# Patient Record
Sex: Male | Born: 1945
Health system: Southern US, Community
[De-identification: ages and names within clinical notes are randomized; demographics above are authoritative.]

## PROBLEM LIST (undated history)

## (undated) DIAGNOSIS — N189 Chronic kidney disease, unspecified: Secondary | ICD-10-CM

## (undated) DIAGNOSIS — I499 Cardiac arrhythmia, unspecified: Secondary | ICD-10-CM

## (undated) DIAGNOSIS — E079 Disorder of thyroid, unspecified: Secondary | ICD-10-CM

## (undated) HISTORY — PX: MULTIPLE TOOTH EXTRACTIONS: SHX2053

## (undated) HISTORY — DX: Disorder of thyroid, unspecified: E07.9

---

## 1999-06-15 ENCOUNTER — Other Ambulatory Visit: Admission: RE | Admit: 1999-06-15 | Discharge: 1999-06-15 | Payer: Self-pay | Admitting: Internal Medicine

## 2012-11-14 ENCOUNTER — Encounter (HOSPITAL_COMMUNITY): Payer: Self-pay | Admitting: Emergency Medicine

## 2012-11-14 ENCOUNTER — Emergency Department (INDEPENDENT_AMBULATORY_CARE_PROVIDER_SITE_OTHER)
Admission: EM | Admit: 2012-11-14 | Discharge: 2012-11-14 | Disposition: A | Discharge status: Home / Self Care | Payer: Medicare Other | Source: Home / Self Care | Attending: Family Medicine | Admitting: Family Medicine

## 2012-11-14 ENCOUNTER — Emergency Department (INDEPENDENT_AMBULATORY_CARE_PROVIDER_SITE_OTHER): Payer: Medicare Other

## 2012-11-14 DIAGNOSIS — S62609B Fracture of unspecified phalanx of unspecified finger, initial encounter for open fracture: Secondary | ICD-10-CM

## 2012-11-14 MED ORDER — HYDROCODONE-ACETAMINOPHEN 5-325 MG PO TABS: 1.0000 | ORAL_TABLET | ORAL | Status: DC | PRN

## 2012-11-14 NOTE — Consult Note (Signed)
Reason for Consult:left long crush injury Referring Physician: [name] is an 67 y.o. male.  HPI: s/p wood spliter injury with open left long finger fracture  History reviewed. No pertinent past medical history.  History reviewed. No pertinent past surgical history.  No family history on file.  Social History:  reports that he has never smoked. He does not have any smokeless tobacco history on file. He reports that he does not drink alcohol or use illicit drugs.  Allergies: No Known Allergies  Medications: Scheduled:  No results found for this or any previous visit (from the past 48 hour(s)).  Dg Finger Middle Left  11/14/2012  *RADIOLOGY REPORT*  Clinical Data: Crush injury.  Laceration.  LEFT MIDDLE FINGER 2+V  Comparison: None  Findings: Highly comminuted fracture of the left third distal phalanx.  No visible intra-articular extension.  Overlying soft tissue defect.  IMPRESSION: Comminuted fracture of the left third distal phalanx.   Original Report Authenticated By: Charlett Nose, M.D.     Review of Systems  All other systems reviewed and are negative.   Blood pressure 141/84, pulse 81, temperature 98.4 F (36.9 C), temperature source Oral, resp. rate 18, SpO2 99.00%. Physical Exam  Constitutional: He is oriented to person, place, and time. He appears well-developed and well-nourished.  HENT:  Head: Normocephalic and atraumatic.  Cardiovascular: Normal rate.   Respiratory: Effort normal.  Musculoskeletal:       Right hand: He exhibits bony tenderness, deformity and laceration.  Neurological: He is alert and oriented to person, place, and time.  Skin: Skin is warm.  Psychiatric: He has Owen normal mood and affect. His behavior is normal. Judgment and thought content normal.    Assessment/Plan: As above  After digital block performed by urgent care MD we performed an I and D and nail bed repair if left long finger  Patient to see me in my office this  James Owen 11/14/2012, 6:33 PM

## 2012-11-14 NOTE — ED Provider Notes (Signed)
History     CSN: [id number]  Arrival date & time 11/14/12  1634   First MD Initiated Contact with Patient 11/14/12 1635      Chief Complaint  Patient presents with  . Finger Injury    (Consider location/radiation/quality/duration/timing/severity/associated sxs/prior treatment) Patient is a 67 y.o. male presenting with hand pain. The history is provided by the patient and the spouse.  Hand Pain This is a new problem. The current episode started 1 to 2 hours ago (lmf caught in wood splitter with crush injury to distal phalanx.). The problem occurs constantly. The problem has not changed since onset.   History reviewed. No pertinent past medical history.  History reviewed. No pertinent past surgical history.  No family history on file.  History  Substance Use Topics  . Smoking status: Never Smoker   . Smokeless tobacco: Not on file  . Alcohol Use: No      Review of Systems  Constitutional: Negative.   Musculoskeletal: Positive for joint swelling.  Skin: Positive for wound.    Allergies  Review of patient's allergies indicates no known allergies.  Home Medications  No current outpatient prescriptions on file.  BP 141/84  Pulse 81  Temp(Src) 98.4 F (36.9 C) (Oral)  Resp 18  SpO2 99%  Physical Exam  Nursing note and vitals reviewed. Constitutional: He is oriented to person, place, and time. He appears well-developed and well-nourished.  Musculoskeletal: He exhibits tenderness.       Hands: Neurological: He is alert and oriented to person, place, and time.  Skin: Skin is warm and dry.    ED Course  NERVE BLOCK Date/Time: 11/14/2012 5:12 PM Performed by: Linna Hoff Authorized by: Bradd Canary D Consent: Verbal consent obtained. Consent given by: patient Indications: pain relief Body area: upper extremity Nerve: digital Laterality: left Patient sedated: no Preparation: Patient was prepped and draped in the usual sterile fashion. Patient position:  sitting Needle gauge: 27 G Local anesthetic: bupivacaine 0.5% without epinephrine Anesthetic total: 4 ml Outcome: pain improved Patient tolerance: Patient tolerated the procedure well with no immediate complications.   (including critical care time)  Labs Reviewed - No data to display Dg Finger Middle Left  11/14/2012  *RADIOLOGY REPORT*  Clinical Data: Crush injury.  Laceration.  LEFT MIDDLE FINGER 2+V  Comparison: None  Findings: Highly comminuted fracture of the left third distal phalanx.  No visible intra-articular extension.  Overlying soft tissue defect.  IMPRESSION: Comminuted fracture of the left third distal phalanx.   Original Report Authenticated By: Charlett Nose, M.D.      1. Finger fracture, left, open, initial encounter       MDM  X-rays reviewed and report per radiologist. Digital block with 2% marcaine.  Referred to dr Mina Marble for repair.         Linna Hoff, MD 11/14/12 [phone]

## 2012-11-14 NOTE — ED Notes (Signed)
Pt is here for inj to left middle finger inj since 1430 today Reports splitting wood in a gas wood splitter, finger got caught in between the tree trunks Laceration below base of nail Reports being up to date w/tetanus Bleeding has been controlled  He is alert w/no signs of acute distress.

## 2013-04-12 ENCOUNTER — Encounter: Payer: Self-pay | Admitting: Internal Medicine

## 2013-06-08 ENCOUNTER — Ambulatory Visit (AMBULATORY_SURGERY_CENTER): Payer: Self-pay

## 2013-06-08 VITALS — Ht 73.0 in | Wt 204.4 lb

## 2013-06-08 DIAGNOSIS — Z860100 Personal history of colon polyps, unspecified: Secondary | ICD-10-CM

## 2013-06-08 MED ORDER — MOVIPREP 100 G PO SOLR
ORAL | Status: DC
Start: 2013-06-08 — End: 2013-06-22

## 2013-06-22 ENCOUNTER — Ambulatory Visit (AMBULATORY_SURGERY_CENTER): Payer: Medicare Other | Admitting: Internal Medicine

## 2013-06-22 ENCOUNTER — Encounter: Payer: Self-pay | Admitting: Internal Medicine

## 2013-06-22 VITALS — BP 131/103 | HR 55 | Temp 96.8°F | Resp 19 | Ht 73.0 in | Wt 204.0 lb

## 2013-06-22 DIAGNOSIS — Z860100 Personal history of colon polyps, unspecified: Secondary | ICD-10-CM

## 2013-06-22 DIAGNOSIS — D126 Benign neoplasm of colon, unspecified: Secondary | ICD-10-CM

## 2013-06-22 MED ORDER — SODIUM CHLORIDE 0.9 % IV SOLN: 500.0000 mL | INTRAVENOUS | Status: DC

## 2013-06-22 NOTE — Op Note (Signed)
 [facility] [address] [place], [address]   COLONOSCOPY PROCEDURE REPORT  PATIENT: James Owen, James Owen  MR#: [id number] BIRTHDATE: 07/09/1946 , 66  yrs. old GENDER: Male ENDOSCOPIST: Roxy Cedar, MD REFERRED WN:[id number] Program Recall PROCEDURE DATE:  06/22/2013 PROCEDURE:   Colonoscopy with snare polypectomy x 1 First Screening Colonoscopy - Avg.  risk and is 50 yrs.  old or older - No.  Prior Negative Screening - Now for repeat screening. N/A  History of Adenoma - Now for follow-up colonoscopy & has been > or = to 3 yrs.  Yes hx of adenoma.  Has been 3 or more years since last colonoscopy.  Polyps Removed Today? Yes. ASA CLASS:   Class II INDICATIONS:Patient's personal history of adenomatous colon polyps. Index 200 (TA); f/u 2004 (no polyps) MEDICATIONS: MAC sedation, administered by CRNA and propofol (Diprivan) 250mg  IV  DESCRIPTION OF PROCEDURE:   After the risks benefits and alternatives of the procedure were thoroughly explained, informed consent was obtained.  A digital rectal exam revealed no abnormalities of the rectum.   The LB [id number]  endoscope was introduced through the anus and advanced to the cecum, which was identified by both the appendix and ileocecal valve. No adverse events experienced.   The quality of the prep was good, using MoviPrep  The instrument was then slowly withdrawn as the colon was fully examined.   COLON FINDINGS: A single polyp measuring 5 mm in size was found in the sigmoid colon.  A polypectomy was performed with a cold snare. The resection was complete and the polyp tissue was completely retrieved. Oozing was managed with hot snare tip.  Moderate diverticulosis was noted in the ascending colon and The finding was left colon.   The colon mucosa was otherwise normal.  Retroflexed views revealed internal hemorrhoids. The time to cecum=3 minutes 40 seconds.  Withdrawal time=13 minutes 0 seconds.  The  scope was withdrawn and the procedure completed. COMPLICATIONS: There were no complications.  ENDOSCOPIC IMPRESSION: 1.   Single polyp measuring 5 mm in size was found in the sigmoid colon; polypectomy was performed 2.   Moderate diverticulosis was noted in the ascending colon and left colon 3.   The colon mucosa was otherwise normal  RECOMMENDATIONS: 1. Repeat colonoscopy in 5 years if polyp adenomatous; otherwise 10 years   eSigned:  Roxy Cedar, MD 06/22/2013 9:29 AM   cc: The Patient and Guerry Bruin, MD   PATIENT NAME:  James Owen, James Owen MR#: [id number]

## 2013-06-22 NOTE — Patient Instructions (Addendum)
YOU HAD AN ENDOSCOPIC PROCEDURE TODAY AT THE [facility]: Refer to the procedure report that was given to you for any specific questions about what was found during the examination.  If the procedure report does not answer your questions, please call your gastroenterologist to clarify.  If you requested that your care partner not be given the details of your procedure findings, then the procedure report has been included in a sealed envelope for you to review at your convenience later.  YOU SHOULD EXPECT: Some feelings of bloating in the abdomen. Passage of more gas than usual.  Walking can help get rid of the air that was put into your GI tract during the procedure and reduce the bloating. If you had a lower endoscopy (such as a colonoscopy or flexible sigmoidoscopy) you may notice spotting of blood in your stool or on the toilet paper. If you underwent a bowel prep for your procedure, then you may not have a normal bowel movement for a few days.  DIET: Your first meal following the procedure should be a light meal and then it is ok to progress to your normal diet.  A half-sandwich or bowl of soup is an example of a good first meal.  Heavy or fried foods are harder to digest and may make you feel nauseous or bloated.  Likewise meals heavy in dairy and vegetables can cause extra gas to form and this can also increase the bloating.  Drink plenty of fluids but you should avoid alcoholic beverages for 24 hours.  ACTIVITY: Your care partner should take you home directly after the procedure.  You should plan to take it easy, moving slowly for the rest of the day.  You can resume normal activity the day after the procedure however you should NOT DRIVE or use heavy machinery for 24 hours (because of the sedation medicines used during the test).    SYMPTOMS TO REPORT IMMEDIATELY: A gastroenterologist can be reached at any hour.  During normal business hours, 8:30 AM to 5:00 PM Monday through Friday,  call [phone].  After hours and on weekends, please call the GI answering service at [phone] who will take a message and have the physician on call contact you.   Following lower endoscopy (colonoscopy or flexible sigmoidoscopy):  Excessive amounts of blood in the stool  Significant tenderness or worsening of abdominal pains  Swelling of the abdomen that is new, acute  Fever of 100F or higher  FOLLOW UP: If any biopsies were taken you will be contacted by phone or by letter within the next 1-3 weeks.  Call your gastroenterologist if you have not heard about the biopsies in 3 weeks.  Our staff will call the home number listed on your records the next business day following your procedure to check on you and address any questions or concerns that you may have at that time regarding the information given to you following your procedure. This is a courtesy call and so if there is no answer at the home number and we have not heard from you through the emergency physician on call, we will assume that you have returned to your regular daily activities without incident.   Please read over information about polyps, high fiber diets and diverticulosis  Continue your normal mediations  Colonoscopy in 5 or 10 years- await pathology SIGNATURES/CONFIDENTIALITY: You and/or your care partner have signed paperwork which will be entered into your electronic medical record.  These signatures attest to  the fact that that the information above on your After Visit Summary has been reviewed and is understood.  Full responsibility of the confidentiality of this discharge information lies with you and/or your care-partner.

## 2013-06-22 NOTE — Progress Notes (Signed)
Lidocaine-40mg IV prior to Propofol InductionPropofol given over incremental dosages 

## 2013-06-22 NOTE — Progress Notes (Signed)
Called to room to assist during endoscopic procedure.  Patient ID and intended procedure confirmed with present staff. Received instructions for my participation in the procedure from the performing physician.  

## 2013-06-22 NOTE — Progress Notes (Signed)
Patient did not experience any of the following events: a burn prior to discharge; a fall within the facility; wrong site/side/patient/procedure/implant event; or a hospital transfer or hospital admission upon discharge from the facility. (G8907) Patient did not have preoperative order for IV antibiotic SSI prophylaxis. (G8918)  

## 2013-06-23 ENCOUNTER — Telehealth: Payer: Self-pay | Admitting: *Deleted

## 2013-06-23 NOTE — Telephone Encounter (Signed)
  Follow up Call-  Call back number 06/22/2013  Post procedure Call Back phone  # [phone]  Permission to leave phone message Yes     Patient questions:  Do you have a fever, pain , or abdominal swelling? no Pain Score  0 *  Have you tolerated food without any problems? yes  Have you been able to return to your normal activities? yes  Do you have any questions about your discharge instructions: Diet   no Medications  no Follow up visit  no  Do you have questions or concerns about your Care? no  Actions: * If pain score is 4 or above: No action needed, pain <4.pt did comment that he went back to work when he got home

## 2013-06-29 ENCOUNTER — Encounter: Payer: Self-pay | Admitting: Internal Medicine

## 2014-01-11 ENCOUNTER — Encounter: Payer: Self-pay | Admitting: Internal Medicine

## 2015-03-28 ENCOUNTER — Encounter: Payer: Self-pay | Admitting: Internal Medicine

## 2017-09-18 ENCOUNTER — Other Ambulatory Visit (HOSPITAL_COMMUNITY): Payer: Self-pay | Admitting: Otolaryngology

## 2017-09-18 ENCOUNTER — Encounter (HOSPITAL_COMMUNITY): Payer: Self-pay

## 2017-09-18 ENCOUNTER — Other Ambulatory Visit (HOSPITAL_COMMUNITY): Payer: Self-pay | Admitting: Internal Medicine

## 2017-09-18 ENCOUNTER — Emergency Department (HOSPITAL_COMMUNITY): Payer: PPO

## 2017-09-18 ENCOUNTER — Inpatient Hospital Stay (HOSPITAL_COMMUNITY)
Admission: EM | Admit: 2017-09-18 | Discharge: 2017-09-21 | DRG: 419 | Disposition: A | Discharge status: Home / Self Care | Payer: PPO | Attending: Surgery | Admitting: Surgery

## 2017-09-18 DIAGNOSIS — K819 Cholecystitis, unspecified: Secondary | ICD-10-CM

## 2017-09-18 DIAGNOSIS — R1033 Periumbilical pain: Secondary | ICD-10-CM | POA: Diagnosis not present

## 2017-09-18 DIAGNOSIS — Z9049 Acquired absence of other specified parts of digestive tract: Secondary | ICD-10-CM

## 2017-09-18 DIAGNOSIS — N289 Disorder of kidney and ureter, unspecified: Secondary | ICD-10-CM | POA: Diagnosis present

## 2017-09-18 DIAGNOSIS — R402413 Glasgow coma scale score 13-15, at hospital admission: Secondary | ICD-10-CM | POA: Diagnosis present

## 2017-09-18 DIAGNOSIS — K8020 Calculus of gallbladder without cholecystitis without obstruction: Secondary | ICD-10-CM

## 2017-09-18 DIAGNOSIS — R1011 Right upper quadrant pain: Secondary | ICD-10-CM

## 2017-09-18 DIAGNOSIS — R5383 Other fatigue: Secondary | ICD-10-CM | POA: Diagnosis not present

## 2017-09-18 DIAGNOSIS — K8000 Calculus of gallbladder with acute cholecystitis without obstruction: Secondary | ICD-10-CM | POA: Diagnosis not present

## 2017-09-18 DIAGNOSIS — I481 Persistent atrial fibrillation: Secondary | ICD-10-CM | POA: Diagnosis not present

## 2017-09-18 DIAGNOSIS — K828 Other specified diseases of gallbladder: Secondary | ICD-10-CM | POA: Diagnosis present

## 2017-09-18 DIAGNOSIS — R197 Diarrhea, unspecified: Secondary | ICD-10-CM

## 2017-09-18 DIAGNOSIS — K810 Acute cholecystitis: Secondary | ICD-10-CM | POA: Diagnosis not present

## 2017-09-18 DIAGNOSIS — Z720 Tobacco use: Secondary | ICD-10-CM | POA: Diagnosis not present

## 2017-09-18 DIAGNOSIS — K8050 Calculus of bile duct without cholangitis or cholecystitis without obstruction: Secondary | ICD-10-CM | POA: Diagnosis present

## 2017-09-18 DIAGNOSIS — R1110 Vomiting, unspecified: Secondary | ICD-10-CM

## 2017-09-18 DIAGNOSIS — R1013 Epigastric pain: Secondary | ICD-10-CM | POA: Diagnosis not present

## 2017-09-18 DIAGNOSIS — E039 Hypothyroidism, unspecified: Secondary | ICD-10-CM | POA: Diagnosis present

## 2017-09-18 DIAGNOSIS — N200 Calculus of kidney: Secondary | ICD-10-CM | POA: Diagnosis not present

## 2017-09-18 DIAGNOSIS — Z79899 Other long term (current) drug therapy: Secondary | ICD-10-CM

## 2017-09-18 DIAGNOSIS — K8066 Calculus of gallbladder and bile duct with acute and chronic cholecystitis without obstruction: Secondary | ICD-10-CM | POA: Diagnosis not present

## 2017-09-18 DIAGNOSIS — I480 Paroxysmal atrial fibrillation: Secondary | ICD-10-CM | POA: Diagnosis not present

## 2017-09-18 DIAGNOSIS — I4891 Unspecified atrial fibrillation: Secondary | ICD-10-CM | POA: Diagnosis present

## 2017-09-18 DIAGNOSIS — R17 Unspecified jaundice: Secondary | ICD-10-CM

## 2017-09-18 DIAGNOSIS — R1031 Right lower quadrant pain: Secondary | ICD-10-CM | POA: Diagnosis not present

## 2017-09-18 DIAGNOSIS — R112 Nausea with vomiting, unspecified: Secondary | ICD-10-CM | POA: Diagnosis not present

## 2017-09-18 DIAGNOSIS — K8012 Calculus of gallbladder with acute and chronic cholecystitis without obstruction: Secondary | ICD-10-CM | POA: Diagnosis not present

## 2017-09-18 DIAGNOSIS — K8062 Calculus of gallbladder and bile duct with acute cholecystitis without obstruction: Principal | ICD-10-CM | POA: Diagnosis present

## 2017-09-18 DIAGNOSIS — R109 Unspecified abdominal pain: Secondary | ICD-10-CM | POA: Diagnosis not present

## 2017-09-18 LAB — CBC
HCT: 46.6 % (ref 39.0–52.0)
HEMOGLOBIN: 15.7 g/dL (ref 13.0–17.0)
MCH: 30.7 pg (ref 26.0–34.0)
MCHC: 33.7 g/dL (ref 30.0–36.0)
MCV: 91 fL (ref 78.0–100.0)
Platelets: 174 10*3/uL (ref 150–400)
RBC: 5.12 MIL/uL (ref 4.22–5.81)
RDW: 12.8 % (ref 11.5–15.5)
WBC: 13.9 10*3/uL — ABNORMAL HIGH (ref 4.0–10.5)

## 2017-09-18 LAB — COMPREHENSIVE METABOLIC PANEL
ALBUMIN: 3.3 g/dL — AB (ref 3.5–5.0)
ALK PHOS: 62 U/L (ref 38–126)
ALT: 17 U/L (ref 17–63)
AST: 23 U/L (ref 15–41)
Anion gap: 8 (ref 5–15)
BILIRUBIN TOTAL: 1.8 mg/dL — AB (ref 0.3–1.2)
BUN: 25 mg/dL — AB (ref 6–20)
CALCIUM: 8.8 mg/dL — AB (ref 8.9–10.3)
CO2: 22 mmol/L (ref 22–32)
Chloride: 103 mmol/L (ref 101–111)
Creatinine, Ser: 1.51 mg/dL — ABNORMAL HIGH (ref 0.61–1.24)
GFR calc Af Amer: 51 mL/min — ABNORMAL LOW (ref >60)
GFR calc non Af Amer: 44 mL/min — ABNORMAL LOW (ref >60)
GLUCOSE: 135 mg/dL — AB (ref 65–99)
Potassium: 4 mmol/L (ref 3.5–5.1)
SODIUM: 133 mmol/L — AB (ref 135–145)
TOTAL PROTEIN: 6.9 g/dL (ref 6.5–8.1)

## 2017-09-18 LAB — LIPASE, BLOOD: Lipase: 29 U/L (ref 11–51)

## 2017-09-18 MED ORDER — SODIUM CHLORIDE 0.9 % IV BOLUS (SEPSIS)
500.0000 mL | Freq: Once | INTRAVENOUS | Status: AC
  Administered 2017-09-18: 500 mL via INTRAVENOUS

## 2017-09-18 MED ORDER — IOPAMIDOL (ISOVUE-300) INJECTION 61%
INTRAVENOUS | Status: AC
  Administered 2017-09-18: 100 mL via INTRAVENOUS
  Filled 2017-09-18: qty 100

## 2017-09-18 MED ORDER — MORPHINE SULFATE (PF) 4 MG/ML IV SOLN
4.0000 mg | Freq: Once | INTRAVENOUS | Status: AC
  Administered 2017-09-18: 4 mg via INTRAVENOUS
  Filled 2017-09-18: qty 1

## 2017-09-18 MED ORDER — ONDANSETRON HCL 4 MG/2ML IJ SOLN
4.0000 mg | Freq: Once | INTRAMUSCULAR | Status: AC
  Administered 2017-09-18: 4 mg via INTRAVENOUS
  Filled 2017-09-18: qty 2

## 2017-09-18 NOTE — ED Provider Notes (Signed)
[facility] DEPT Provider Note   CSN: [id number] Arrival date & time: 09/18/17  1943     History   Chief Complaint Chief Complaint  Patient presents with  . Abdominal Pain    HPI James Owen is a 72 y.o. male.  The history is provided by the patient and medical records.  Abdominal Pain   Associated symptoms include nausea and vomiting.     72 year old male with history of thyroid disease, presenting to the ED with abdominal pain.  This is been ongoing since Monday but steadily worsening over the past 4 days.  He was seen by his primary care doctor earlier today and had some lab work done which revealed an elevated white blood cell count.  He was scheduled for an outpatient CT first thing in the morning but when he returned home from his appointment pain worsened and he began vomiting so wife called EMS.  He states he has had a little bit of diarrhea, but is mostly having trouble eating.  States he has been able to drink small amounts of water but mediately vomits up any type of solid food.  He has noticed his abdomen is also distended.  He is able to pass gas.  He had a normal bowel movement yesterday.  No prior abdominal surgeries.  He did try taking some Tylenol earlier today for pain, however vomited that up as well.  Past Medical History:  Diagnosis Date  . Thyroid disease     There are no active problems to display for this patient.   Past Surgical History:  Procedure Laterality Date  . MULTIPLE TOOTH EXTRACTIONS     has all dentures       Home Medications    Prior to Admission medications   Medication Sig Start Date End Date Taking? Authorizing Provider  HYDROcodone-acetaminophen (NORCO/VICODIN) 5-325 MG per tablet Take 1 tablet by mouth every 4 (four) hours as needed for pain. 11/14/12   Billy Fischer, MD  levothyroxine (SYNTHROID, LEVOTHROID) 50 MCG tablet Take 50 mcg by mouth daily before breakfast.    [provider]  Multiple Vitamin (MULTIVITAMIN) tablet Take 1 tablet by mouth daily.    [provider]    Family History Family History  Problem Relation Age of Onset  . Melanoma Sister   . Breast cancer Sister     Social History Social History   Tobacco Use  . Smoking status: Never Smoker  . Smokeless tobacco: Current User    Types: Chew  . Tobacco comment: occasionally  Substance Use Topics  . Alcohol use: Yes    Comment: SELDOM  . Drug use: No     Allergies   Patient has no known allergies.   Review of Systems Review of Systems  Gastrointestinal: Positive for abdominal pain, nausea and vomiting.  All other systems reviewed and are negative.    Physical Exam Updated Vital Signs BP 112/81 (BP Location: Left Arm)   Pulse 75   Temp 98.7 F (37.1 C) (Oral)   Resp 18   SpO2 97%   Physical Exam  Constitutional: He is oriented to person, place, and time. He appears well-developed and well-nourished.  Appears uncomfortable  HENT:  Head: Normocephalic and atraumatic.  Mouth/Throat: Oropharynx is clear and moist.  Dry mucous membranes  Eyes: Conjunctivae and EOM are normal. Pupils are equal, round, and reactive to light.  Neck: Normal range of motion.  Cardiovascular: Normal rate, regular rhythm and normal heart sounds.  Pulmonary/Chest: Effort normal and breath sounds normal.  Abdominal: Soft. Bowel sounds are normal. He exhibits distension. There is tenderness in the right lower quadrant, epigastric area and periumbilical area.  Abdomen is distended and firm but not rigid, there is tenderness in the epigastric, periumbilical, and right lower quadrant areas  Musculoskeletal: Normal range of motion.  Neurological: He is alert and oriented to person, place, and time.  Skin: Skin is warm and dry.  Psychiatric: He has a normal mood and affect.  Nursing note and vitals reviewed.    ED Treatments / Results  Labs (all labs ordered are listed, but only  abnormal results are displayed) Labs Reviewed  COMPREHENSIVE METABOLIC PANEL - Abnormal; Notable for the following components:      Result Value   Sodium 133 (*)    Glucose, Bld 135 (*)    BUN 25 (*)    Creatinine, Ser 1.51 (*)    Calcium 8.8 (*)    Albumin 3.3 (*)    Total Bilirubin 1.8 (*)    GFR calc non Af Amer 44 (*)    GFR calc Af Amer 51 (*)    All other components within normal limits  CBC - Abnormal; Notable for the following components:   WBC 13.9 (*)    All other components within normal limits  LIPASE, BLOOD  URINALYSIS, ROUTINE W REFLEX MICROSCOPIC    EKG  EKG Interpretation None       Radiology No results found.  Procedures Procedures (including critical care time)  Medications Ordered in ED Medications  sodium chloride 0.9 % bolus 500 mL (500 mLs Intravenous New Bag/Given 09/18/17 2239)  morphine 4 MG/ML injection 4 mg (4 mg Intravenous Given 09/18/17 2239)  ondansetron (ZOFRAN) injection 4 mg (4 mg Intravenous Given 09/18/17 2239)     Initial Impression / Assessment and Plan / ED Course  I have reviewed the triage vital signs and the nursing notes.  Pertinent labs & imaging results that were available during my care of the patient were reviewed by me and considered in my medical decision making (see chart for details).  72 year old male presenting to the ED with abdominal pain.  Ongoing since Monday worsening.  Reports associated nausea, vomiting, diarrhea.  Seen by PCP today and scheduled for CT in the morning, however could not wait.  On exam here he does appear uncomfortable but is nontoxic.  Abdomen appears distended and firm but no rigidity.  Tenderness in the epigastric, periumbilical, and right lower abdomen.  Screening labs obtained, mild leukocytosis as well as mild elevation of bilirubin.  LFTs and alk phos are normal.  Will proceed with CT scan here.  Small bolus of IV fluids, pain and nausea medications ordered.  Currently pain-free after  medications.  CT does reveal findings of acute cholecystitis.  Patient and family updated.  Will discuss with general surgery for admission.  12:46 AM Discussed with Dr. Dema Severin-- labs and imaging reviewed.  He requested RUQ [place] which was ordered.  He will come evaluate patient.  Final Clinical Impressions(s) / ED Diagnoses   Final diagnoses:  Cholecystitis    ED Discharge Orders    None       Larene Pickett, PA-C 09/19/17 0455    Dorie Rank, MD 09/20/17 [phone]

## 2017-09-18 NOTE — ED Notes (Signed)
Patient has I set of blood cultures in the lab

## 2017-09-18 NOTE — ED Triage Notes (Signed)
Pt complains of lower right quad pain for one week, primary Dr did blood work and has a CT scheduled for tomorrow, the doctor told him if he was getting worse to come the ED, pt started vomiting tonight

## 2017-09-19 ENCOUNTER — Encounter (HOSPITAL_COMMUNITY): Admission: EM | Disposition: A | Discharge status: Home / Self Care | Payer: Self-pay | Source: Home / Self Care

## 2017-09-19 ENCOUNTER — Inpatient Hospital Stay (HOSPITAL_COMMUNITY): Payer: PPO

## 2017-09-19 ENCOUNTER — Emergency Department (HOSPITAL_COMMUNITY): Payer: PPO

## 2017-09-19 ENCOUNTER — Other Ambulatory Visit: Payer: Self-pay

## 2017-09-19 ENCOUNTER — Inpatient Hospital Stay (HOSPITAL_COMMUNITY): Payer: PPO | Admitting: Anesthesiology

## 2017-09-19 DIAGNOSIS — R17 Unspecified jaundice: Secondary | ICD-10-CM

## 2017-09-19 DIAGNOSIS — K819 Cholecystitis, unspecified: Secondary | ICD-10-CM

## 2017-09-19 DIAGNOSIS — K8050 Calculus of bile duct without cholangitis or cholecystitis without obstruction: Secondary | ICD-10-CM | POA: Diagnosis present

## 2017-09-19 DIAGNOSIS — Z9049 Acquired absence of other specified parts of digestive tract: Secondary | ICD-10-CM

## 2017-09-19 DIAGNOSIS — R112 Nausea with vomiting, unspecified: Secondary | ICD-10-CM

## 2017-09-19 DIAGNOSIS — R1011 Right upper quadrant pain: Secondary | ICD-10-CM | POA: Diagnosis not present

## 2017-09-19 DIAGNOSIS — K8062 Calculus of gallbladder and bile duct with acute cholecystitis without obstruction: Secondary | ICD-10-CM | POA: Diagnosis present

## 2017-09-19 DIAGNOSIS — N289 Disorder of kidney and ureter, unspecified: Secondary | ICD-10-CM | POA: Diagnosis present

## 2017-09-19 DIAGNOSIS — K828 Other specified diseases of gallbladder: Secondary | ICD-10-CM | POA: Diagnosis present

## 2017-09-19 DIAGNOSIS — E039 Hypothyroidism, unspecified: Secondary | ICD-10-CM | POA: Diagnosis present

## 2017-09-19 DIAGNOSIS — I481 Persistent atrial fibrillation: Secondary | ICD-10-CM | POA: Diagnosis not present

## 2017-09-19 DIAGNOSIS — R1013 Epigastric pain: Secondary | ICD-10-CM

## 2017-09-19 DIAGNOSIS — Z720 Tobacco use: Secondary | ICD-10-CM | POA: Diagnosis not present

## 2017-09-19 DIAGNOSIS — R402413 Glasgow coma scale score 13-15, at hospital admission: Secondary | ICD-10-CM | POA: Diagnosis present

## 2017-09-19 DIAGNOSIS — I4891 Unspecified atrial fibrillation: Secondary | ICD-10-CM | POA: Diagnosis present

## 2017-09-19 DIAGNOSIS — Z79899 Other long term (current) drug therapy: Secondary | ICD-10-CM | POA: Diagnosis not present

## 2017-09-19 HISTORY — PX: CHOLECYSTECTOMY: SHX55

## 2017-09-19 LAB — URINALYSIS, ROUTINE W REFLEX MICROSCOPIC
BACTERIA UA: NONE SEEN
Bilirubin Urine: NEGATIVE
Glucose, UA: NEGATIVE mg/dL
KETONES UR: NEGATIVE mg/dL
Leukocytes, UA: NEGATIVE
Nitrite: NEGATIVE
PH: 5 (ref 5.0–8.0)
Protein, ur: 30 mg/dL — AB

## 2017-09-19 LAB — CBC
HEMATOCRIT: 43.9 % (ref 39.0–52.0)
Hemoglobin: 14.8 g/dL (ref 13.0–17.0)
MCH: 30.8 pg (ref 26.0–34.0)
MCHC: 33.7 g/dL (ref 30.0–36.0)
MCV: 91.5 fL (ref 78.0–100.0)
Platelets: 159 10*3/uL (ref 150–400)
RBC: 4.8 MIL/uL (ref 4.22–5.81)
RDW: 13.1 % (ref 11.5–15.5)
WBC: 13.6 10*3/uL — AB (ref 4.0–10.5)

## 2017-09-19 LAB — COMPREHENSIVE METABOLIC PANEL
ALBUMIN: 3 g/dL — AB (ref 3.5–5.0)
ALK PHOS: 55 U/L (ref 38–126)
ALT: 17 U/L (ref 17–63)
ANION GAP: 6 (ref 5–15)
AST: 22 U/L (ref 15–41)
BUN: 26 mg/dL — AB (ref 6–20)
CO2: 22 mmol/L (ref 22–32)
Calcium: 8.5 mg/dL — ABNORMAL LOW (ref 8.9–10.3)
Chloride: 107 mmol/L (ref 101–111)
Creatinine, Ser: 1.53 mg/dL — ABNORMAL HIGH (ref 0.61–1.24)
GFR calc Af Amer: 50 mL/min — ABNORMAL LOW (ref >60)
GFR calc non Af Amer: 43 mL/min — ABNORMAL LOW (ref >60)
GLUCOSE: 120 mg/dL — AB (ref 65–99)
Potassium: 4.1 mmol/L (ref 3.5–5.1)
SODIUM: 135 mmol/L (ref 135–145)
Total Bilirubin: 2.2 mg/dL — ABNORMAL HIGH (ref 0.3–1.2)
Total Protein: 6.3 g/dL — ABNORMAL LOW (ref 6.5–8.1)

## 2017-09-19 LAB — MAGNESIUM: Magnesium: 2 mg/dL (ref 1.7–2.4)

## 2017-09-19 LAB — MRSA PCR SCREENING: MRSA by PCR: NEGATIVE

## 2017-09-19 LAB — PHOSPHORUS: Phosphorus: 2.1 mg/dL — ABNORMAL LOW (ref 2.5–4.6)

## 2017-09-19 SURGERY — LAPAROSCOPIC CHOLECYSTECTOMY WITH INTRAOPERATIVE CHOLANGIOGRAM
Anesthesia: General | Site: Abdomen

## 2017-09-19 MED ORDER — IOPAMIDOL (ISOVUE-300) INJECTION 61%
INTRAVENOUS | Status: AC
  Filled 2017-09-19: qty 50

## 2017-09-19 MED ORDER — HEPARIN SODIUM (PORCINE) 5000 UNIT/ML IJ SOLN
5000.0000 [IU] | Freq: Three times a day (TID) | INTRAMUSCULAR | Status: DC
  Administered 2017-09-19 – 2017-09-21 (×5): 5000 [IU] via SUBCUTANEOUS
  Filled 2017-09-19 (×5): qty 1

## 2017-09-19 MED ORDER — ACETAMINOPHEN 10 MG/ML IV SOLN
1000.0000 mg | Freq: Four times a day (QID) | INTRAVENOUS | Status: DC
  Administered 2017-09-19: 1000 mg via INTRAVENOUS

## 2017-09-19 MED ORDER — LIDOCAINE 2% (20 MG/ML) 5 ML SYRINGE
INTRAMUSCULAR | Status: DC | PRN
  Administered 2017-09-19: 100 mg via INTRAVENOUS

## 2017-09-19 MED ORDER — DOCUSATE SODIUM 100 MG PO CAPS
200.0000 mg | ORAL_CAPSULE | Freq: Two times a day (BID) | ORAL | Status: DC
  Administered 2017-09-19 – 2017-09-20 (×2): 200 mg via ORAL
  Filled 2017-09-19 (×2): qty 2

## 2017-09-19 MED ORDER — ESMOLOL HCL 100 MG/10ML IV SOLN
INTRAVENOUS | Status: DC | PRN
  Administered 2017-09-19: 20 mg via INTRAVENOUS
  Administered 2017-09-19: 10 mg via INTRAVENOUS
  Administered 2017-09-19: 20 mg via INTRAVENOUS
  Administered 2017-09-19: 30 mg via INTRAVENOUS
  Administered 2017-09-19 (×2): 20 mg via INTRAVENOUS
  Administered 2017-09-19: 30 mg via INTRAVENOUS

## 2017-09-19 MED ORDER — ROCURONIUM BROMIDE 50 MG/5ML IV SOSY
PREFILLED_SYRINGE | INTRAVENOUS | Status: AC
  Filled 2017-09-19: qty 5

## 2017-09-19 MED ORDER — LIDOCAINE 2% (20 MG/ML) 5 ML SYRINGE
INTRAMUSCULAR | Status: AC
  Filled 2017-09-19: qty 5

## 2017-09-19 MED ORDER — ONDANSETRON HCL 4 MG/2ML IJ SOLN: 4.0000 mg | Freq: Four times a day (QID) | INTRAMUSCULAR | Status: DC | PRN

## 2017-09-19 MED ORDER — LACTATED RINGERS IR SOLN
Status: DC | PRN
  Administered 2017-09-19: 1000 mL

## 2017-09-19 MED ORDER — METOPROLOL TARTRATE 5 MG/5ML IV SOLN
INTRAVENOUS | Status: AC
  Filled 2017-09-19: qty 10

## 2017-09-19 MED ORDER — DOCUSATE SODIUM 100 MG PO CAPS
200.0000 mg | ORAL_CAPSULE | Freq: Two times a day (BID) | ORAL | Status: DC
  Filled 2017-09-19: qty 2

## 2017-09-19 MED ORDER — LEVOTHYROXINE SODIUM 100 MCG IV SOLR
12.5000 ug | Freq: Every day | INTRAVENOUS | Status: DC
  Administered 2017-09-19 – 2017-09-20 (×2): 12.5 ug via INTRAVENOUS
  Filled 2017-09-19 (×2): qty 5

## 2017-09-19 MED ORDER — ACETAMINOPHEN 325 MG PO TABS
650.0000 mg | ORAL_TABLET | Freq: Four times a day (QID) | ORAL | Status: DC
  Administered 2017-09-19: 650 mg via ORAL
  Filled 2017-09-19: qty 2

## 2017-09-19 MED ORDER — ONDANSETRON 4 MG PO TBDP: 4.0000 mg | ORAL_TABLET | Freq: Four times a day (QID) | ORAL | Status: DC | PRN

## 2017-09-19 MED ORDER — OXYCODONE HCL 5 MG PO TABS: 10.0000 mg | ORAL_TABLET | Freq: Four times a day (QID) | ORAL | Status: DC | PRN

## 2017-09-19 MED ORDER — ESMOLOL HCL 100 MG/10ML IV SOLN
INTRAVENOUS | Status: AC
  Filled 2017-09-19: qty 20

## 2017-09-19 MED ORDER — ONDANSETRON HCL 4 MG/2ML IJ SOLN
INTRAMUSCULAR | Status: AC
  Filled 2017-09-19: qty 2

## 2017-09-19 MED ORDER — HYDROCODONE-ACETAMINOPHEN 5-325 MG PO TABS
1.0000 | ORAL_TABLET | ORAL | Status: DC | PRN
  Administered 2017-09-19: 1 via ORAL
  Filled 2017-09-19: qty 1

## 2017-09-19 MED ORDER — PROPOFOL 10 MG/ML IV BOLUS
INTRAVENOUS | Status: DC | PRN
  Administered 2017-09-19: 120 mg via INTRAVENOUS
  Administered 2017-09-19: 20 mg via INTRAVENOUS

## 2017-09-19 MED ORDER — HYDROMORPHONE HCL 1 MG/ML IJ SOLN
0.5000 mg | INTRAMUSCULAR | Status: DC | PRN
  Administered 2017-09-19 (×3): 0.5 mg via INTRAVENOUS
  Filled 2017-09-19: qty 1

## 2017-09-19 MED ORDER — ACETAMINOPHEN 10 MG/ML IV SOLN: 1000.0000 mg | Freq: Once | INTRAVENOUS | Status: DC

## 2017-09-19 MED ORDER — LEVOTHYROXINE SODIUM 50 MCG PO TABS
50.0000 ug | ORAL_TABLET | Freq: Every day | ORAL | Status: DC
  Filled 2017-09-19: qty 1

## 2017-09-19 MED ORDER — ORAL CARE MOUTH RINSE
15.0000 mL | Freq: Two times a day (BID) | OROMUCOSAL | Status: DC
  Administered 2017-09-19 – 2017-09-20 (×4): 15 mL via OROMUCOSAL

## 2017-09-19 MED ORDER — SUGAMMADEX SODIUM 200 MG/2ML IV SOLN
INTRAVENOUS | Status: AC
  Filled 2017-09-19: qty 2

## 2017-09-19 MED ORDER — ACETAMINOPHEN 10 MG/ML IV SOLN
INTRAVENOUS | Status: AC
  Filled 2017-09-19: qty 100

## 2017-09-19 MED ORDER — SIMETHICONE 80 MG PO CHEW
40.0000 mg | CHEWABLE_TABLET | Freq: Four times a day (QID) | ORAL | Status: DC | PRN
  Filled 2017-09-19: qty 1

## 2017-09-19 MED ORDER — ROCURONIUM BROMIDE 50 MG/5ML IV SOSY
PREFILLED_SYRINGE | INTRAVENOUS | Status: DC | PRN
  Administered 2017-09-19 (×2): 10 mg via INTRAVENOUS
  Administered 2017-09-19: 40 mg via INTRAVENOUS

## 2017-09-19 MED ORDER — BUPIVACAINE-EPINEPHRINE 0.25% -1:200000 IJ SOLN
INTRAMUSCULAR | Status: DC | PRN
  Administered 2017-09-19: 35 mL

## 2017-09-19 MED ORDER — ACETAMINOPHEN 500 MG PO TABS
1000.0000 mg | ORAL_TABLET | Freq: Four times a day (QID) | ORAL | Status: DC
  Administered 2017-09-20 – 2017-09-21 (×5): 1000 mg via ORAL
  Filled 2017-09-19 (×5): qty 2

## 2017-09-19 MED ORDER — METOPROLOL TARTRATE 5 MG/5ML IV SOLN
INTRAVENOUS | Status: DC | PRN
  Administered 2017-09-19 (×3): 1 mg via INTRAVENOUS
  Administered 2017-09-19: 2 mg via INTRAVENOUS
  Administered 2017-09-19: 3 mg via INTRAVENOUS
  Administered 2017-09-19 (×3): 1 mg via INTRAVENOUS
  Administered 2017-09-19: 2 mg via INTRAVENOUS
  Administered 2017-09-19 (×2): 1 mg via INTRAVENOUS

## 2017-09-19 MED ORDER — ACETAMINOPHEN 160 MG/5ML PO SOLN: 1000.0000 mg | Freq: Once | ORAL | Status: DC

## 2017-09-19 MED ORDER — ALUM & MAG HYDROXIDE-SIMETH 200-200-20 MG/5ML PO SUSP
30.0000 mL | ORAL | Status: DC | PRN
  Administered 2017-09-19 – 2017-09-21 (×4): 30 mL via ORAL
  Filled 2017-09-19 (×4): qty 30

## 2017-09-19 MED ORDER — PIPERACILLIN-TAZOBACTAM 3.375 G IVPB
3.3750 g | Freq: Three times a day (TID) | INTRAVENOUS | Status: DC
  Administered 2017-09-19: 3.375 mg via INTRAVENOUS
  Administered 2017-09-19: 3.375 g via INTRAVENOUS
  Filled 2017-09-19 (×3): qty 50

## 2017-09-19 MED ORDER — PHENYLEPHRINE HCL 10 MG/ML IJ SOLN
INTRAVENOUS | Status: DC | PRN
  Administered 2017-09-19: 40 ug/min via INTRAVENOUS

## 2017-09-19 MED ORDER — LACTATED RINGERS IV SOLN
INTRAVENOUS | Status: DC
  Administered 2017-09-19 (×3): via INTRAVENOUS

## 2017-09-19 MED ORDER — METOPROLOL TARTRATE 5 MG/5ML IV SOLN
INTRAVENOUS | Status: AC
  Filled 2017-09-19: qty 5

## 2017-09-19 MED ORDER — ONDANSETRON HCL 4 MG/2ML IJ SOLN
4.0000 mg | Freq: Four times a day (QID) | INTRAMUSCULAR | Status: DC | PRN
  Administered 2017-09-19: 4 mg via INTRAVENOUS

## 2017-09-19 MED ORDER — FENTANYL CITRATE (PF) 100 MCG/2ML IJ SOLN
INTRAMUSCULAR | Status: DC | PRN
  Administered 2017-09-19 (×2): 50 ug via INTRAVENOUS

## 2017-09-19 MED ORDER — LIP MEDEX EX OINT
TOPICAL_OINTMENT | CUTANEOUS | Status: AC
  Filled 2017-09-19: qty 7

## 2017-09-19 MED ORDER — PIPERACILLIN-TAZOBACTAM 3.375 G IVPB
INTRAVENOUS | Status: AC
  Filled 2017-09-19: qty 50

## 2017-09-19 MED ORDER — 0.9 % SODIUM CHLORIDE (POUR BTL) OPTIME
TOPICAL | Status: DC | PRN
  Administered 2017-09-19: 1000 mL

## 2017-09-19 MED ORDER — LACTATED RINGERS IV SOLN
INTRAVENOUS | Status: DC
  Administered 2017-09-19: 18:00:00 via INTRAVENOUS

## 2017-09-19 MED ORDER — BUPIVACAINE-EPINEPHRINE 0.25% -1:200000 IJ SOLN
INTRAMUSCULAR | Status: AC
  Filled 2017-09-19: qty 1

## 2017-09-19 MED ORDER — PHENYLEPHRINE 40 MCG/ML (10ML) SYRINGE FOR IV PUSH (FOR BLOOD PRESSURE SUPPORT)
PREFILLED_SYRINGE | INTRAVENOUS | Status: AC
  Filled 2017-09-19: qty 10

## 2017-09-19 MED ORDER — DEXAMETHASONE SODIUM PHOSPHATE 10 MG/ML IJ SOLN
INTRAMUSCULAR | Status: AC
  Filled 2017-09-19: qty 1

## 2017-09-19 MED ORDER — METOPROLOL TARTRATE 5 MG/5ML IV SOLN
5.0000 mg | Freq: Four times a day (QID) | INTRAVENOUS | Status: DC | PRN
  Administered 2017-09-20: 5 mg via INTRAVENOUS
  Filled 2017-09-19: qty 5

## 2017-09-19 MED ORDER — SODIUM CHLORIDE 0.9 % IV BOLUS (SEPSIS)
1000.0000 mL | Freq: Once | INTRAVENOUS | Status: AC
  Administered 2017-09-19: 1000 mL via INTRAVENOUS

## 2017-09-19 MED ORDER — PROPOFOL 10 MG/ML IV BOLUS
INTRAVENOUS | Status: AC
  Filled 2017-09-19: qty 20

## 2017-09-19 MED ORDER — SUGAMMADEX SODIUM 200 MG/2ML IV SOLN
INTRAVENOUS | Status: DC | PRN
  Administered 2017-09-19: 200 mg via INTRAVENOUS

## 2017-09-19 MED ORDER — DEXAMETHASONE SODIUM PHOSPHATE 10 MG/ML IJ SOLN
INTRAMUSCULAR | Status: DC | PRN
  Administered 2017-09-19: 10 mg via INTRAVENOUS

## 2017-09-19 MED ORDER — HYDROMORPHONE HCL 1 MG/ML IJ SOLN
INTRAMUSCULAR | Status: AC
  Filled 2017-09-19: qty 2

## 2017-09-19 MED ORDER — FENTANYL CITRATE (PF) 100 MCG/2ML IJ SOLN: 25.0000 ug | INTRAMUSCULAR | Status: DC | PRN

## 2017-09-19 MED ORDER — FENTANYL CITRATE (PF) 250 MCG/5ML IJ SOLN
INTRAMUSCULAR | Status: AC
  Filled 2017-09-19: qty 5

## 2017-09-19 SURGICAL SUPPLY — 35 items
ADH SKN CLS APL DERMABOND .7 (GAUZE/BANDAGES/DRESSINGS) ×1
APPLIER CLIP ROT 10 11.4 M/L (STAPLE) ×3
APR CLP MED LRG 11.4X10 (STAPLE) ×1
BAG SPEC RTRVL LRG 6X4 10 (ENDOMECHANICALS) ×2
CABLE HIGH FREQUENCY MONO STRZ (ELECTRODE) ×3 IMPLANT
CHLORAPREP W/TINT 26ML (MISCELLANEOUS) ×6 IMPLANT
CLIP APPLIE ROT 10 11.4 M/L (STAPLE) ×1 IMPLANT
CLOSURE WOUND 1/2 X4 (GAUZE/BANDAGES/DRESSINGS) ×1
COVER MAYO STAND STRL (DRAPES) ×3 IMPLANT
COVER SURGICAL LIGHT HANDLE (MISCELLANEOUS) ×3 IMPLANT
DECANTER SPIKE VIAL GLASS SM (MISCELLANEOUS) ×3 IMPLANT
DERMABOND ADVANCED (GAUZE/BANDAGES/DRESSINGS) ×2
DERMABOND ADVANCED .7 DNX12 (GAUZE/BANDAGES/DRESSINGS) IMPLANT
DRAPE C-ARM 42X120 X-RAY (DRAPES) ×3 IMPLANT
ELECT REM PT RETURN 15FT ADLT (MISCELLANEOUS) ×3 IMPLANT
GAUZE SPONGE 2X2 8PLY STRL LF (GAUZE/BANDAGES/DRESSINGS) ×1 IMPLANT
GLOVE SURG ORTHO 8.0 STRL STRW (GLOVE) ×3 IMPLANT
GOWN STRL REUS W/TWL XL LVL3 (GOWN DISPOSABLE) ×6 IMPLANT
HEMOSTAT SURGICEL 4X8 (HEMOSTASIS) IMPLANT
KIT BASIN OR (CUSTOM PROCEDURE TRAY) ×3 IMPLANT
POUCH SPECIMEN RETRIEVAL 10MM (ENDOMECHANICALS) ×5 IMPLANT
SCISSORS LAP 5X35 DISP (ENDOMECHANICALS) ×3 IMPLANT
SET CHOLANGIOGRAPH MIX (MISCELLANEOUS) ×3 IMPLANT
SET IRRIG TUBING LAPAROSCOPIC (IRRIGATION / IRRIGATOR) ×3 IMPLANT
SLEEVE XCEL OPT CAN 5 100 (ENDOMECHANICALS) ×3 IMPLANT
SPONGE GAUZE 2X2 STER 10/PKG (GAUZE/BANDAGES/DRESSINGS) ×2
STRIP CLOSURE SKIN 1/2X4 (GAUZE/BANDAGES/DRESSINGS) ×2 IMPLANT
SUT MNCRL AB 4-0 PS2 18 (SUTURE) ×5 IMPLANT
TOWEL OR 17X26 10 PK STRL BLUE (TOWEL DISPOSABLE) ×3 IMPLANT
TOWEL OR NON WOVEN STRL DISP B (DISPOSABLE) ×3 IMPLANT
TRAY LAPAROSCOPIC (CUSTOM PROCEDURE TRAY) ×3 IMPLANT
TROCAR BLADELESS OPT 5 100 (ENDOMECHANICALS) ×3 IMPLANT
TROCAR XCEL BLUNT TIP 100MML (ENDOMECHANICALS) ×3 IMPLANT
TROCAR XCEL NON-BLD 11X100MML (ENDOMECHANICALS) ×3 IMPLANT
TUBING INSUF HEATED (TUBING) IMPLANT

## 2017-09-19 NOTE — Progress Notes (Signed)
Patient to surgery on bed. Family present during transfer. Patient's IV accidentally dislodged by patient during undress anticipating surgery. PIV removed as surgery tech arrived, unable to attempt restart.

## 2017-09-19 NOTE — Progress Notes (Signed)
Pharmacy Antibiotic Note  James Owen is a 72 y.o. male admitted on 09/18/2017 with intra-abdominal infection.  Pharmacy has been consulted for Zosyn dosing.  Plan: Zosyn 3.375g IV q8h (4 hour infusion).  Monitor clinical course, renal function, cultures as available  Dosage remains stable at above dosage and need for further dosage adjustment appears unlikely at present.    Will sign off at this time.  Please reconsult if a change in clinical status warrants re-evaluation of dosage.         Temp (24hrs), Avg:99 F (37.2 C), Min:98.7 F (37.1 C), Max:99.3 F (37.4 C)  Recent Labs  Lab 09/18/17 2053 09/19/17 0517  WBC 13.9* 13.6*  CREATININE 1.51* 1.53*    CrCl cannot be calculated (Unknown ideal weight.).    No Known Allergies  Antimicrobials this admission: 1/4 Zosyn >>    Dose adjustments this admission: ---  Microbiology results:   Thank you for allowing pharmacy to be a part of this patient's care.  Royetta Asal, PharmD, BCPS Pager [phone] 09/19/2017 7:06 AM

## 2017-09-19 NOTE — Op Note (Signed)
09/19/2017 4:03 PM  PATIENT: James Owen  72 y.o. male  Patient Care Team: Tisovec, Fransico Him, MD as PCP - General (Internal Medicine)  PRE-OPERATIVE DIAGNOSIS: cholelithiasis  POST-OPERATIVE DIAGNOSIS: cholelithiasis  PROCEDURE: Laparoscopic cholecystectomy  SURGEON: Sharon Mt. Dema Severin, MD  ASSISTANT: Armandina Gemma, MD   ANESTHESIA: General endotracheal  EBL: Total I/O In: 0  Out: 69 [Blood:50]  DRAINS: None  SPECIMEN: Gallbladder  COUNTS: Sponge, needle and instrument counts were reported correct x2 at the conclusion of the operation  DISPOSITION: PACU in satisfactory condition  FINDINGS: Acute gangrenous cholecystitis; critical view of safety established before ligating or dividing any structures. Due to the tenuous nature of the tissues in the triangle of calot and potentially short cystic duct, a cholangiogram was not able to be obtained. A piece of surgicel was laid on the cystic plate at the end of the procedure  INDICATION:  [name] is an 72 y.o. male who is here for RUQ pain which started for the first time on 09/15/17. The pain got a bit better of the next day but returned yesterday. He was seen by his PCP who ordered a CT scan for later today but advised him to come to ED if nausea/vomiting. Subsequently had emesis and presented to ED for evaluation. Pain is sharp, RUQ, nonradiating. No prior episodes of this before this week. Has also had associated episodes of diarrhea. +Chills at home; tmax 100F.  WBC was noted to be 13K. Total bilirubin was mildly elevated at 1.8. He underwent CT a/p and a RUQ [place] which demonstrated gallstones in the gallbladder, acute cholecystitis and a mildly dilated common bile duct.  He was admitted to the hospital, given IV fluids, IV antibiotics and made n.p.o.  His LFTs were repeated earlier in the morning today and demonstrated a total bilirubin of 2.2.  GI consultation was sought for ERCP and they evaluate the patient  and did not feel his hyperbilirubinemia was secondary to a retained stone and recommended he undergo surgery with an intraoperative cholangiogram.  The anatomy & physiology of hepatobiliary & pancreatic function was discussed. The pathophysiology of gallbladder dysfunction was discussed. Natural history and material risks without surgery was discussed. I feel the risks of no intervention will lead to serious problems that outweigh the operative risks; therefore, I recommended cholecystectomy to remove the probable pathology. I explained laparoscopic techniques with possible need for an open approach. Additionally, I discussed the possible need for a cholangiogram to evaluate the bilary tract..  The procedure, material risks (including but not limited to bleeding; infection; scarring; abscess; bile leak; injury to other organs such as bowel, blood vessels, nerves, bile duct; hernia; the need for additional procedures; heart attack; stroke; death), benefits and alternatives to surgery were discussed at length. The patient's questions were answered to their satisfaction and the patient elected to proceed with surgery.  I noted a good likelihood this will help address the problem.  Possibility that this will not correct all abdominal symptoms was also explained.  Goals of post-operative recovery were discussed as well.     DESCRIPTION:  The patient was identified & brought into the operating room. The patient was positioned supine on the OR table. SCDs were in place and active during the entire case.  On arrival to the operating room the patient was noted to be in atrial fibrillation with rapid ventricular response and was assessed by our anesthesiologist.  A 12-lead EKG was obtained demonstrating atrial fibrillation with rapid ventricular response and  they administered multiple medications including esmolol and metoprolol to rate control his A. fib.  After this had been performed the anesthesia team felt it was  safe for him to undergo general anesthetic and the patient therefore underwent the patient underwent general endotracheal anesthesia. The abdomen was prepped and draped in the standard sterile fashion and antibiotics were administered. A surgical timeout was performed and confirmed our plan.   A periumbilical incision was made. The umbilical stalk was grasped and retracted outwardly. The supraumbilical fascia was identified and incised. The peritoneal cavity was gently entered bluntly. A purse-string 0 Vicryl suture was placed. The Hasson cannula was inserted into the peritoneal cavity and insufflation with CO2 commenced to 52mmHg. A laparoscope was inserted into the peritoneal cavity and inspection confirmed no evidence of trocar site complications. The patient was then positioned in reverse Trendelenburg with the left side down. An 57mm trocar was placed in the mid epigastric region and 2 additional 43mm trocars were placed along the right subcostal line - 72mm port in the right flank near the anterior axillary line, and a third 34mm port in the left subxiphoid region obliquely near the falciform ligament.  The liver and gallbladder were inspected.  Gallbladder was acutely inflamed and gangrenous.  The gallbladder was tense.  The gallbladder could not be grasped due to hypertensive was and was therefore aspirated.  The material aspirated from the gallbladder was purulent and brown. The gallbladder fundus was grasped and elevated cephalad.  Omental attachments to the gallbladder were swept down gently as well as the duodenum and this was done in a blunt manner taking care to protect the duodenum from any injury. An additional grasper was then placed on the infundibulum of the gallbladder and the infundibulum was retracted laterally. Gentle blunt dissection was then employed with a IT consultant working down into [employer]. The peritoneum on both sides of the gallbladder was opened with hook cautery.  The cystic duct was identified, carefully circumferentially dissected. The cystic artery was then carefully circumferentially dissected. The space between the cystic artery and hepatocystic plate was developed such that the liver could be seen through a window medial to the cystic artery. The triangle of Calot was cleared of all fibrofatty tissue. At this point, a critical view of safety was achieved and the only structures visualized was the skeletonized cystic duct laterally, the skeletonized cystic artery and the liver through the window medial to the artery.   The gallbladder as well as cystic duct were quite friable and fragile due to the acute gangrenous inflammation of the overlying gallbladder and the cystic duct appeared to be fairly short.  Given all of this it was felt to be unsafe to perform a cholangiogram with the risk of avulsing the cystic duct off of the common bile duct.  The cystic duct and artery were therefore clipped with 2 clips on the "stay" side and 1 clip on the specimen side. The cystic duct and artery were then divided. The gallbladder was then freed from its remaining attachments to the liver using electrocautery and placed into an endocatch bag. Hemostasis was achieved and then re-verified. The rest of the abdomen was inspected no injury nor bleeding elsewhere was identified. The RUQ was irrigated with normal saline. The clips and gallbladder fossa were reinspected and noted to be in place and hemostatic.  The gallbladder and endocatch bag was then removed from the umbilical port site and passed off as specimen. The RUQ ports were removed under direct  visualization and noted to be hemostatic. The umbilical fascia was then closed using 0 Vicryl suture. The skin of all incision sites was approximated with 4-0 monocryl subcuticular suture and dermabond applied. The patient was then extubated and transferred to a stretcher for transport to PACU in satisfactory condition.  POSTOP  DISPOSITION: Step-down unit with cardiology consultation

## 2017-09-19 NOTE — Anesthesia Procedure Notes (Addendum)
Procedure Name: Intubation Date/Time: 09/19/2017 2:43 PM Performed by: Montel Clock, CRNA Pre-anesthesia Checklist: Patient identified, Emergency Drugs available, Suction available and Patient being monitored Patient Re-evaluated:Patient Re-evaluated prior to induction Oxygen Delivery Method: Circle system utilized Preoxygenation: Pre-oxygenation with 100% oxygen Induction Type: IV induction Ventilation: Two handed mask ventilation required and Oral airway inserted - appropriate to patient size Laryngoscope Size: Mac and 4 Grade View: Grade I Tube type: Oral Tube size: 7.5 mm Number of attempts: 1 Airway Equipment and Method: Stylet and Oral airway Placement Confirmation: ETT inserted through vocal cords under direct vision,  positive ETCO2 and breath sounds checked- equal and bilateral Secured at: 22 cm Tube secured with: Tape Dental Injury: Teeth and Oropharynx as per pre-operative assessment

## 2017-09-19 NOTE — Anesthesia Preprocedure Evaluation (Signed)
Anesthesia Evaluation  Patient identified by MRN, date of birth, ID band Patient awake    Reviewed: Allergy & Precautions, H&P , Patient's Chart, lab work & pertinent test results, reviewed documented beta blocker date and time   Airway Mallampati: II  TM Distance: >3 FB Neck ROM: full    Dental no notable dental hx.    Pulmonary    Pulmonary exam normal breath sounds clear to auscultation       Cardiovascular  Rhythm:regular Rate:Normal     Neuro/Psych    GI/Hepatic   Endo/Other  Hypothyroidism   Renal/GU      Musculoskeletal   Abdominal   Peds  Hematology   Anesthesia Other Findings   Reproductive/Obstetrics                             Anesthesia Physical Anesthesia Plan  ASA: II  Anesthesia Plan: General   Post-op Pain Management:    Induction: Intravenous  PONV Risk Score and Plan: 2 and Dexamethasone, Ondansetron and Treatment may vary due to age or medical condition  Airway Management Planned: Oral ETT  Additional Equipment:   Intra-op Plan:   Post-operative Plan: Extubation in OR  Informed Consent: I have reviewed the patients History and Physical, chart, labs and discussed the procedure including the risks, benefits and alternatives for the proposed anesthesia with the patient or authorized representative who has indicated his/her understanding and acceptance.   Dental Advisory Given  Plan Discussed with: CRNA and Surgeon  Anesthesia Plan Comments: (  )        Anesthesia Quick Evaluation

## 2017-09-19 NOTE — Progress Notes (Signed)
Doing better this afternoon. Still with RUQ pain.  The anatomy & physiology of hepatobiliary & pancreatic function was discussed.  The pathophysiology of gallbladder dysfunction was discussed.  Natural history risks without surgery was discussed.   I feel the risks of no intervention will lead to serious problems that outweigh the operative risks; therefore, I recommended cholecystectomy to remove the pathology.  I explained laparoscopic techniques with possible need for an open approach.  Cholangiogram to evaluate the bilary tract was explained as well.    Risks such as bleeding, infection, abscess, bile leak, injury to other organs/viscus/common bile duct, need for further treatment, hernia, heart attack, stroke, death, and other risks were discussed.  I noted a good likelihood this will help address the problem.  Possibility that this will not correct all abdominal symptoms was explained.  Goals of post-operative recovery were discussed as well. His questions were answered to his satisfaction, he expressed understand and he elected to proceed.  Sharon Mt. Dema Severin, M.D. [facility]

## 2017-09-19 NOTE — H&P (Signed)
CC: RUQ pain, nausea, emesis  HPI: James Owen is an 72 y.o. male who is here for RUQ pain which started for the first time on 09/15/17. The pain got a bit better of the next day but returned yesterday. He was seen by his PCP who ordered a CT scan for later today but advised him to come to ED if nausea/vomiting. Subsequently had emesis and presented to ED for evaluation. Pain is sharp, RUQ, nonradiating. No prior episodes of this before this week. Has also had associated episodes of diarrhea. +Chills at home; tmax 100F.   Past Medical History:  Diagnosis Date  . Thyroid disease     Past Surgical History:  Procedure Laterality Date  . MULTIPLE TOOTH EXTRACTIONS     has all dentures    Family History  Problem Relation Age of Onset  . Melanoma Sister   . Breast cancer Sister     Social:  reports that  has never smoked. His smokeless tobacco use includes chew. He reports that he drinks alcohol. He reports that he does not use drugs.  Allergies: No Known Allergies  Medications: I have reviewed the patient's current medications.  Results for orders placed or performed during the hospital encounter of 09/18/17 (from the past 48 hour(s))  Lipase, blood     Status: None   Collection Time: 09/18/17  8:53 PM  Result Value Ref Range   Lipase 29 11 - 51 U/L  Comprehensive metabolic panel     Status: Abnormal   Collection Time: 09/18/17  8:53 PM  Result Value Ref Range   Sodium 133 (L) 135 - 145 mmol/L   Potassium 4.0 3.5 - 5.1 mmol/L   Chloride 103 101 - 111 mmol/L   CO2 22 22 - 32 mmol/L   Glucose, Bld 135 (H) 65 - 99 mg/dL   BUN 25 (H) 6 - 20 mg/dL   Creatinine, Ser 1.51 (H) 0.61 - 1.24 mg/dL   Calcium 8.8 (L) 8.9 - 10.3 mg/dL   Total Protein 6.9 6.5 - 8.1 g/dL   Albumin 3.3 (L) 3.5 - 5.0 g/dL   AST 23 15 - 41 U/L   ALT 17 17 - 63 U/L   Alkaline Phosphatase 62 38 - 126 U/L   Total Bilirubin 1.8 (H) 0.3 - 1.2 mg/dL   GFR calc non Af Amer 44 (L) >60 mL/min   GFR  calc Af Amer 51 (L) >60 mL/min    Comment: (NOTE) The eGFR has been calculated using the CKD EPI equation. This calculation has not been validated in all clinical situations. eGFR's persistently <60 mL/min signify possible Chronic Kidney Disease.    Anion gap 8 5 - 15  CBC     Status: Abnormal   Collection Time: 09/18/17  8:53 PM  Result Value Ref Range   WBC 13.9 (H) 4.0 - 10.5 K/uL   RBC 5.12 4.22 - 5.81 MIL/uL   Hemoglobin 15.7 13.0 - 17.0 g/dL   HCT 46.6 39.0 - 52.0 %   MCV 91.0 78.0 - 100.0 fL   MCH 30.7 26.0 - 34.0 pg   MCHC 33.7 30.0 - 36.0 g/dL   RDW 12.8 11.5 - 15.5 %   Platelets 174 150 - 400 K/uL  Urinalysis, Routine w reflex microscopic     Status: Abnormal   Collection Time: 09/19/17 12:14 AM  Result Value Ref Range   Color, Urine YELLOW YELLOW   APPearance CLEAR CLEAR   Specific Gravity, Urine >1.046 (H) 1.005 - 1.030  pH 5.0 5.0 - 8.0   Glucose, UA NEGATIVE NEGATIVE mg/dL   Hgb urine dipstick LARGE (A) NEGATIVE   Bilirubin Urine NEGATIVE NEGATIVE   Ketones, ur NEGATIVE NEGATIVE mg/dL   Protein, ur 30 (A) NEGATIVE mg/dL   Nitrite NEGATIVE NEGATIVE   Leukocytes, UA NEGATIVE NEGATIVE   RBC / HPF 6-30 0 - 5 RBC/hpf   WBC, UA 6-30 0 - 5 WBC/hpf   Bacteria, UA NONE SEEN NONE SEEN   Squamous Epithelial / LPF 0-5 (A) NONE SEEN    Ct Abdomen Pelvis W Contrast  Result Date: 09/19/2017 CLINICAL DATA:  Initial evaluation for acute right lower quadrant pain. EXAM: CT ABDOMEN AND PELVIS WITH CONTRAST TECHNIQUE: Multidetector CT imaging of the abdomen and pelvis was performed using the standard protocol following bolus administration of intravenous contrast. CONTRAST:  131m ISOVUE-300 IOPAMIDOL (ISOVUE-300) INJECTION 61% COMPARISON:  None available. FINDINGS: Lower chest: Small bibasilar pleural effusions with associated atelectasis. Small pericardial fusion noted as well. Hepatobiliary: Hyperemia noted within the hepatic parenchyma about the gallbladder fossa. Liver  otherwise unremarkable. Gallbladder enlarged with associated wall thickening, mucosal enhancement, and pericholecystic fat stranding, suspicious for acute cholecystitis. Scattered internal hyperdensity likely reflects stones. Common bile duct dilated up to 1 cm. Subtle soft tissue density within the distal common bile duct suspicious for possible obstructive choledocholithiasis (series 5, image 47). Mild central intrahepatic biliary dilatation noted as well. Pancreas: Pancreas within normal limits. No pancreatic ductal dilatation or mass lesion. Spleen: Spleen within normal limits. Adrenals/Urinary Tract: Adrenal glands are normal. Kidneys demonstrate fairly symmetric enhancement. Scattered bilateral renal hypodensities, some of which are too small to characterize, but statistically most likely reflect cysts. Prominent parapelvic cysts at the left kidney. 7 mm nonobstructive calculus. No other radiopaque calculi. No focal enhancing renal mass. No hydroureter. Bladder within normal limits. Stomach/Bowel: Stomach within normal limits. Mild wall thickening about the duodenum related to the adjacent inflammatory process within the gallbladder. Similarly, mild wall thickening about the colon at the level of the hepatic flexure also likely reactive. No evidence for obstruction. Appendix is normal. Colonic diverticulosis without evidence for acute diverticulitis. No other acute inflammatory changes about the bowels. Vascular/Lymphatic: Normal intravascular enhancement seen throughout the intra-abdominal aorta. Mesenteric vessels patent proximally. No adenopathy. Reproductive: Normal prostate. Other: No free air or fluid. Small fat containing paraumbilical hernia noted. Musculoskeletal: No acute osseus abnormality. No worrisome lytic or blastic osseous lesions. Prominent facet arthropathy noted within the lower lumbar spine. Diffusely flowing bulky osteophytic spurring suggestive of DISH. IMPRESSION: 1. Extensive  inflammatory changes about the gallbladder, concerning for acute cholecystitis. Scattered internal hyperdensity suspicious for cholelithiasis. Mild intra and extrahepatic biliary dilatation, with possible obstructive stone within the distal common bile duct as above. 2. No other acute intra-abdominopelvic process.  Normal appendix. 3. Diverticulosis without evidence for acute diverticulitis. 4. 7 mm nonobstructive left renal nephrolithiasis. Electronically Signed   By: BJeannine BogaM.D.   On: 09/19/2017 00:11   [place]dom[place] Limited Ruq  Result Date: 09/19/2017 CLINICAL DATA:  Right upper quadrant pain for 4 days. Nausea and vomiting. Cholecystitis seen on CT. EXAM: ULTRASOUND ABDOMEN LIMITED RIGHT UPPER QUADRANT COMPARISON:  CT abdomen and pelvis 09/18/2017 FINDINGS: Gallbladder: Cholelithiasis with multiple stones in the gallbladder, largest measuring about 1.3 cm diameter. Gallbladder is distended and the gallbladder wall is thickened. Murphy's sign is positive. There is pericholecystic edema and mild gallbladder sludge. Common bile duct: Diameter: 7.9 mm, mildly dilated. Mild intrahepatic bile duct dilatation. No common duct stones are seen  but the distal common bile duct is obscured by bowel gas. Liver: No focal lesion identified. Within normal limits in parenchymal echogenicity. Portal vein is patent on color Doppler imaging with normal direction of blood flow towards the liver. IMPRESSION: Described changes are consistent with acute cholecystitis and cholelithiasis. Mild bile duct dilatation. No common duct stones are visualized but the distal common bile duct is obscured by overlying bowel gas and is indeterminate. Electronically Signed   By: Lucienne Capers M.D.   On: 09/19/2017 02:04    ROS - all of the below systems have been reviewed with the patient and positives are indicated with bold text General: chills, fever or night sweats Eyes: blurry vision or double vision ENT: epistaxis or sore  throat Allergy/Immunology: itchy/watery eyes or nasal congestion Hematologic/Lymphatic: bleeding problems, blood clots or swollen lymph nodes Endocrine: temperature intolerance or unexpected weight changes Breast: new or changing breast lumps or nipple discharge Resp: cough, shortness of breath, or wheezing CV: chest pain or dyspnea on exertion GI: as per HPI GU: dysuria, trouble voiding, or hematuria MSK: joint pain or joint stiffness Neuro: TIA or stroke symptoms Derm: pruritus and skin lesion changes Psych: anxiety and depression  PE Blood pressure 105/73, pulse 70, temperature 98.7 F (37.1 C), temperature source Oral, resp. rate 16, SpO2 95 %. Constitutional: NAD; conversant; no deformities Eyes: Moist conjunctiva; no lid lag; anicteric; PERRL Neck: Trachea midline; no thyromegaly Lungs: Normal respiratory effort; no tactile fremitus CV: RRR; no palpable thrills; no pitting edema GI: Abd soft, moderately ttp in RUQ with positive Murphy's sign; no r/g; no palpable hepatosplenomegaly MSK: No deformities; no clubbing/cyanosis Psychiatric: Appropriate affect; alert and oriented x3 Lymphatic: No palpable cervical or axillary lymphadenopathy  Results for orders placed or performed during the hospital encounter of 09/18/17 (from the past 48 hour(s))  Lipase, blood     Status: None   Collection Time: 09/18/17  8:53 PM  Result Value Ref Range   Lipase 29 11 - 51 U/L  Comprehensive metabolic panel     Status: Abnormal   Collection Time: 09/18/17  8:53 PM  Result Value Ref Range   Sodium 133 (L) 135 - 145 mmol/L   Potassium 4.0 3.5 - 5.1 mmol/L   Chloride 103 101 - 111 mmol/L   CO2 22 22 - 32 mmol/L   Glucose, Bld 135 (H) 65 - 99 mg/dL   BUN 25 (H) 6 - 20 mg/dL   Creatinine, Ser 1.51 (H) 0.61 - 1.24 mg/dL   Calcium 8.8 (L) 8.9 - 10.3 mg/dL   Total Protein 6.9 6.5 - 8.1 g/dL   Albumin 3.3 (L) 3.5 - 5.0 g/dL   AST 23 15 - 41 U/L   ALT 17 17 - 63 U/L   Alkaline Phosphatase 62 38  - 126 U/L   Total Bilirubin 1.8 (H) 0.3 - 1.2 mg/dL   GFR calc non Af Amer 44 (L) >60 mL/min   GFR calc Af Amer 51 (L) >60 mL/min    Comment: (NOTE) The eGFR has been calculated using the CKD EPI equation. This calculation has not been validated in all clinical situations. eGFR's persistently <60 mL/min signify possible Chronic Kidney Disease.    Anion gap 8 5 - 15  CBC     Status: Abnormal   Collection Time: 09/18/17  8:53 PM  Result Value Ref Range   WBC 13.9 (H) 4.0 - 10.5 K/uL   RBC 5.12 4.22 - 5.81 MIL/uL   Hemoglobin 15.7 13.0 - 17.0 g/dL  HCT 46.6 39.0 - 52.0 %   MCV 91.0 78.0 - 100.0 fL   MCH 30.7 26.0 - 34.0 pg   MCHC 33.7 30.0 - 36.0 g/dL   RDW 12.8 11.5 - 15.5 %   Platelets 174 150 - 400 K/uL  Urinalysis, Routine w reflex microscopic     Status: Abnormal   Collection Time: 09/19/17 12:14 AM  Result Value Ref Range   Color, Urine YELLOW YELLOW   APPearance CLEAR CLEAR   Specific Gravity, Urine >1.046 (H) 1.005 - 1.030   pH 5.0 5.0 - 8.0   Glucose, UA NEGATIVE NEGATIVE mg/dL   Hgb urine dipstick LARGE (A) NEGATIVE   Bilirubin Urine NEGATIVE NEGATIVE   Ketones, ur NEGATIVE NEGATIVE mg/dL   Protein, ur 30 (A) NEGATIVE mg/dL   Nitrite NEGATIVE NEGATIVE   Leukocytes, UA NEGATIVE NEGATIVE   RBC / HPF 6-30 0 - 5 RBC/hpf   WBC, UA 6-30 0 - 5 WBC/hpf   Bacteria, UA NONE SEEN NONE SEEN   Squamous Epithelial / LPF 0-5 (A) NONE SEEN    Ct Abdomen Pelvis W Contrast  Result Date: 09/19/2017 CLINICAL DATA:  Initial evaluation for acute right lower quadrant pain. EXAM: CT ABDOMEN AND PELVIS WITH CONTRAST TECHNIQUE: Multidetector CT imaging of the abdomen and pelvis was performed using the standard protocol following bolus administration of intravenous contrast. CONTRAST:  173m ISOVUE-300 IOPAMIDOL (ISOVUE-300) INJECTION 61% COMPARISON:  None available. FINDINGS: Lower chest: Small bibasilar pleural effusions with associated atelectasis. Small pericardial fusion noted as well.  Hepatobiliary: Hyperemia noted within the hepatic parenchyma about the gallbladder fossa. Liver otherwise unremarkable. Gallbladder enlarged with associated wall thickening, mucosal enhancement, and pericholecystic fat stranding, suspicious for acute cholecystitis. Scattered internal hyperdensity likely reflects stones. Common bile duct dilated up to 1 cm. Subtle soft tissue density within the distal common bile duct suspicious for possible obstructive choledocholithiasis (series 5, image 47). Mild central intrahepatic biliary dilatation noted as well. Pancreas: Pancreas within normal limits. No pancreatic ductal dilatation or mass lesion. Spleen: Spleen within normal limits. Adrenals/Urinary Tract: Adrenal glands are normal. Kidneys demonstrate fairly symmetric enhancement. Scattered bilateral renal hypodensities, some of which are too small to characterize, but statistically most likely reflect cysts. Prominent parapelvic cysts at the left kidney. 7 mm nonobstructive calculus. No other radiopaque calculi. No focal enhancing renal mass. No hydroureter. Bladder within normal limits. Stomach/Bowel: Stomach within normal limits. Mild wall thickening about the duodenum related to the adjacent inflammatory process within the gallbladder. Similarly, mild wall thickening about the colon at the level of the hepatic flexure also likely reactive. No evidence for obstruction. Appendix is normal. Colonic diverticulosis without evidence for acute diverticulitis. No other acute inflammatory changes about the bowels. Vascular/Lymphatic: Normal intravascular enhancement seen throughout the intra-abdominal aorta. Mesenteric vessels patent proximally. No adenopathy. Reproductive: Normal prostate. Other: No free air or fluid. Small fat containing paraumbilical hernia noted. Musculoskeletal: No acute osseus abnormality. No worrisome lytic or blastic osseous lesions. Prominent facet arthropathy noted within the lower lumbar spine.  Diffusely flowing bulky osteophytic spurring suggestive of DISH. IMPRESSION: 1. Extensive inflammatory changes about the gallbladder, concerning for acute cholecystitis. Scattered internal hyperdensity suspicious for cholelithiasis. Mild intra and extrahepatic biliary dilatation, with possible obstructive stone within the distal common bile duct as above. 2. No other acute intra-abdominopelvic process.  Normal appendix. 3. Diverticulosis without evidence for acute diverticulitis. 4. 7 mm nonobstructive left renal nephrolithiasis. Electronically Signed   By: BJeannine BogaM.D.   On: 09/19/2017 00:11   [place]  Abdomen Limited Ruq  Result Date: 09/19/2017 CLINICAL DATA:  Right upper quadrant pain for 4 days. Nausea and vomiting. Cholecystitis seen on CT. EXAM: ULTRASOUND ABDOMEN LIMITED RIGHT UPPER QUADRANT COMPARISON:  CT abdomen and pelvis 09/18/2017 FINDINGS: Gallbladder: Cholelithiasis with multiple stones in the gallbladder, largest measuring about 1.3 cm diameter. Gallbladder is distended and the gallbladder wall is thickened. Murphy's sign is positive. There is pericholecystic edema and mild gallbladder sludge. Common bile duct: Diameter: 7.9 mm, mildly dilated. Mild intrahepatic bile duct dilatation. No common duct stones are seen but the distal common bile duct is obscured by bowel gas. Liver: No focal lesion identified. Within normal limits in parenchymal echogenicity. Portal vein is patent on color Doppler imaging with normal direction of blood flow towards the liver. IMPRESSION: Described changes are consistent with acute cholecystitis and cholelithiasis. Mild bile duct dilatation. No common duct stones are visualized but the distal common bile duct is obscured by overlying bowel gas and is indeterminate. Electronically Signed   By: Lucienne Capers M.D.   On: 09/19/2017 02:04    A/P: Tennyson Wacha is a pleasant 72 y.o. male with hx of hypothyroidism presents with acute cholecystitis,  possible choledocholithiasis  -Admit to surgery -1L bolus given in ED -NPO, IVF, IV abx -Repeat lab work this morning, probable GI consult -PPx: SCDs; holding chemical DVT prophylaxis in the event that he needs EUS/ERCP later today -The anatomy and physiology of the hepatobiliary tract was described in detail; the pathophysiology of his condition was elaborated. He understands all of this. The overall treatment plan was additionally discussed and rational for surgery for his condition. He and his families questions were answered.  Sharon Mt. Dema Severin, M.D. [facility]

## 2017-09-19 NOTE — Transfer of Care (Signed)
Immediate Anesthesia Transfer of Care Note  Patient: James Owen  Procedure(s) Performed: LAPAROSCOPIC CHOLECYSTECTOMY (N/A Abdomen)  Patient Location: PACU  Anesthesia Type:General  Level of Consciousness: awake, alert , oriented and patient cooperative  Airway & Oxygen Therapy: Patient Spontanous Breathing and Patient connected to face mask oxygen  Post-op Assessment: Report given to RN, Post -op Vital signs reviewed and stable and Patient moving all extremities X 4  Post vital signs: Reviewed and stable  Last Vitals:  Vitals:   09/19/17 0615 09/19/17 0912  BP: 105/72 106/65  Pulse: 84 (!) 59  Resp: 16 16  Temp:  37.2 C  SpO2: 93% 96%    Last Pain:  Vitals:   09/19/17 0912  TempSrc: Oral  PainSc:          Complications: No apparent anesthesia complications

## 2017-09-19 NOTE — ED Notes (Signed)
[phone] donna daughter. Call when PT move or changes [phone] [facility]

## 2017-09-19 NOTE — Consult Note (Signed)
Consultation  Referring Provider: [facility]/ Dr Harlow Asa Primary Care Physician:  Osborne Casco, Fransico Him, MD Primary Gastroenterologist:  Dr.Perry  Reason for Consultation:   possible CBD stone  HPI: James Owen is a 72 y.o. male known to Dr. Scarlette Shorts from previous  [place]. Patient is generally in good health. James Owen He was admitted through the emergency room last night after acute onset of epigastric abdominal pain, nausea vomiting and mild diarrhea which all started Monday, 09/15/2017. He said initially thought he had the flu as he was unable to keep anything down. Then he developed upper abdominal pain which was fairly severe, and varying in intensity. He saw his primary care physician yesterday and was to get scheduled for a CT scan, but was told to go to the ER if he develops further nausea and vomiting. He felt worse last night had further vomiting, he also had some chills and sweats the day previous. Evaluation in the ER showed an elevated WBC of 13.9, hemoglobin 15.7, BUN 25, creatinine 1.51, LFTs entirely normal with the exception of bilirubin at 1.8, lipase within normal limits. Upper abdominal ultrasound showed cholelithiasis with multiple gallstones in the gallbladder the largest measuring 1.3 cm gallbladder is distended and gallbladder wall is thickened and there was also pericholecystic edema, CBD of 7.9 mm no common bile duct stones seen but distal duct was obscured. CT was done which also shows hyperemia around the hepatic parenchyma and gallbladder fossa enlarged gallbladder with gallbladder wall thickening and mucosal enhancement and pericholecystic fat stranding consistent with acute cholecystitis, CBD 1 cm and subtle soft tissue density within the CBD suspicious for possible stone, no pancreatic inflammation. Labs today again showed normal LFTs with the exception of T bili at 2.2  Patient has been started on Zosyn, he is afebrile this morning. He is uncomfortable but  says his pain is not as severe as it was yesterday. He says he feels weak because he hasn't been able to eat or drink anything all week.   Past Medical History:  Diagnosis Date  . Thyroid disease     Past Surgical History:  Procedure Laterality Date  . MULTIPLE TOOTH EXTRACTIONS     has all dentures    Prior to Admission medications   Medication Sig Start Date End Date Taking? Authorizing Provider  levothyroxine (SYNTHROID, LEVOTHROID) 50 MCG tablet Take 50 mcg by mouth daily before breakfast.   Yes [provider]    Current Facility-Administered Medications  Medication Dose Route Frequency Provider Last Rate Last Dose  . acetaminophen (TYLENOL) tablet 650 mg  650 mg Oral Q6H Ileana Roup, MD   650 mg at 09/19/17 0519  . docusate sodium (COLACE) capsule 200 mg  200 mg Oral BID Ileana Roup, MD      . HYDROmorphone (DILAUDID) injection 0.5 mg  0.5 mg Intravenous Q3H PRN Ileana Roup, MD   0.5 mg at 09/19/17 0518  . lactated ringers infusion   Intravenous Continuous Ileana Roup, MD 125 mL/hr at 09/19/17 [phone]    . levothyroxine (SYNTHROID, LEVOTHROID) injection 12.5 mcg  12.5 mcg Intravenous Daily Earnstine Regal, PA-C      . ondansetron (ZOFRAN-ODT) disintegrating tablet 4 mg  4 mg Oral Q6H PRN Ileana Roup, MD       Or  . ondansetron [facility]) injection 4 mg  4 mg Intravenous Q6H PRN Ileana Roup, MD      . oxyCODONE (Oxy IR/ROXICODONE) immediate release tablet 10 mg  10  mg Oral Q6H PRN Ileana Roup, MD      . piperacillin-tazobactam (ZOSYN) IVPB 3.375 g  3.375 g Intravenous Q8H Ileana Roup, MD 12.5 mL/hr at 09/19/17 0521 3.375 g at 09/19/17 0521  . simethicone (MYLICON) chewable tablet 40 mg  40 mg Oral Q6H PRN Ileana Roup, MD        Allergies as of 09/18/2017  . (No Known Allergies)    Family History  Problem Relation Age of Onset  . Melanoma Sister   . Breast cancer Sister     Social  History   Socioeconomic History  . Marital status: Married    Spouse name: Not on file  . Number of children: Not on file  . Years of education: Not on file  . Highest education level: Not on file  Social Needs  . Financial resource strain: Not on file  . Food insecurity - worry: Not on file  . Food insecurity - inability: Not on file  . Transportation needs - medical: Not on file  . Transportation needs - non-medical: Not on file  Occupational History  . Not on file  Tobacco Use  . Smoking status: Never Smoker  . Smokeless tobacco: Current User    Types: Chew  . Tobacco comment: occasionally  Substance and Sexual Activity  . Alcohol use: Yes    Comment: SELDOM  . Drug use: No  . Sexual activity: Not on file  Other Topics Concern  . Not on file  Social History Narrative  . Not on file    Review of Systems: Pertinent positive and negative review of systems were noted in the above HPI section.  All other review of systems was otherwise negative. Physical Exam: Vital signs in last 24 hours: Temp:  [98.7 F (37.1 C)-99.3 F (37.4 C)] 98.9 F (37.2 C) (01/04 0912) Pulse Rate:  [28-88] 59 (01/04 0912) Resp:  [16-20] 16 (01/04 0912) BP: (105-118)/(65-87) 106/65 (01/04 0912) SpO2:  [93 %-97 %] 96 % (01/04 0912)   General:   Alert,  Well-developed, older white male well-nourished, pleasant and cooperative in NAD, uncomfortable appearing Head:  Normocephalic and atraumatic. Eyes:  Sclera clear, no icterus.   Conjunctiva pink. Ears:  Normal auditory acuity. Nose:  No deformity, discharge,  or lesions. Mouth:  No deformity or lesions.   Neck:  Supple; no masses or thyromegaly. Lungs:  Clear throughout to auscultation.   No wheezes, crackles, or rhonchi. Heart:  Tachycardia Regular rate and rhythm; no murmurs, clicks, rubs,  or gallops. Abdomen:  Soft, he is quite tender in the right upper quadrant with guarding no palpable mass or hepatosplenomegaly bowel sounds are present     Rectal:  Deferred  Msk:  Symmetrical without gross deformities. . Pulses:  Normal pulses noted. Extremities:  Without clubbing or edema. Neurologic:  Alert and  oriented x4;  grossly normal neurologically. Skin:  Intact without significant lesions or rashes.. Psych:  Alert and cooperative. Normal mood and affect.  Intake/Output from previous day: No intake/output data recorded. Intake/Output this shift: No intake/output data recorded.  Lab Results: Recent Labs    09/18/17 2053 09/19/17 0517  WBC 13.9* 13.6*  HGB 15.7 14.8  HCT 46.6 43.9  PLT 174 159   BMET Recent Labs    09/18/17 2053 09/19/17 0517  NA 133* 135  K 4.0 4.1  CL 103 107  CO2 22 22  GLUCOSE 135* 120*  BUN 25* 26*  CREATININE 1.51* 1.53*  CALCIUM 8.8* 8.5*  LFT Recent Labs    09/19/17 0517  PROT 6.3*  ALBUMIN 3.0*  AST 22  ALT 17  ALKPHOS 55  BILITOT 2.2*      IMPRESSION:  #85 72 year old white male with 4 day history of abrupt onset of epigastric and right upper quadrant abdominal pain nausea vomiting and inability to keep down by mouth's. He clearly has acute cholecystitis, both on ultrasound and CT scan. It is not clear that he has a common bile duct stone though he does have a mildly dilated duct. His bilirubin is mildly elevated but otherwise LFTs are completely normal which argues against choledocholithiasis.  #2 hypothyroidism #3 history of adenomatous colon polyps-up-to-date with colonoscopy due for follow-up 2019   PLAN: Dr. Henrene Pastor has reviewed the case, he is covering for biliary today. As he does not have any evidence of cholangitis, and it is not clear that he has choledocholithiasis, but clearly does have acute cholecystitis, we advise patient to have laparoscopic cholecystectomy with IOC, hopefully today, and ERCP can be done over the weekend if needed. Continue IV Zosyn Follow labs I have discussed the situation with the patient and his daughter at length, and they voiced  understanding.   George Haggart  09/19/2017, 9:23 AM

## 2017-09-19 NOTE — Progress Notes (Signed)
    CC:  RUQ pain, nausea and emesis   Subjective: Still having pain RUQ, anxious and wants to get this over quickly.  He is not toxic in appearance.   Objective: Vital signs in last 24 hours: Temp:  [98.7 F (37.1 C)-99.3 F (37.4 C)] 98.7 F (37.1 C) (01/03 2212) Pulse Rate:  [28-88] 84 (01/04 0615) Resp:  [16-20] 16 (01/04 0615) BP: (105-118)/(72-87) 105/72 (01/04 0615) SpO2:  [93 %-97 %] 93 % (01/04 0615)  No I/O so far Afebrile, VSS  Some HR listed at 140's  Bilirubin up more this AM Creatinine stable at 1.53  Intake/Output from previous day: No intake/output data recorded. Intake/Output this shift: No intake/output data recorded.  General appearance: alert, cooperative and no distress Resp: clear to auscultation bilaterally GI: tender RUQ, few bS, no significant distension.    Lab Results:  Recent Labs    09/18/17 2053 09/19/17 0517  WBC 13.9* 13.6*  HGB 15.7 14.8  HCT 46.6 43.9  PLT 174 159    BMET Recent Labs    09/18/17 2053 09/19/17 0517  NA 133* 135  K 4.0 4.1  CL 103 107  CO2 22 22  GLUCOSE 135* 120*  BUN 25* 26*  CREATININE 1.51* 1.53*  CALCIUM 8.8* 8.5*   PT/INR No results for input(s): LABPROT, INR in the last 72 hours.  Recent Labs  Lab 09/18/17 2053 09/19/17 0517  AST 23 22  ALT 17 17  ALKPHOS 62 55  BILITOT 1.8* 2.2*  PROT 6.9 6.3*  ALBUMIN 3.3* 3.0*     Lipase     Component Value Date/Time   LIPASE 29 09/18/2017 2053     Prior to Admission medications   Medication Sig Start Date End Date Taking? Authorizing Provider  levothyroxine (SYNTHROID, LEVOTHROID) 50 MCG tablet Take 50 mcg by mouth daily before breakfast.   Yes [provider]    Medications: . acetaminophen  650 mg Oral Q6H  . docusate sodium  200 mg Oral BID  . levothyroxine  50 mcg Oral QAC breakfast   . lactated ringers 125 mL/hr at 09/19/17 0517  . piperacillin-tazobactam (ZOSYN)  IV 3.375 g (09/19/17 0521)   Anti-infectives (From  admission, onward)   Start     Dose/Rate Route Frequency Ordered Stop   09/19/17 0515  piperacillin-tazobactam (ZOSYN) IVPB 3.375 g     3.375 g 12.5 mL/hr over 240 Minutes Intravenous Every 8 hours 09/19/17 0508        Assessment/Plan Acute cholecystitis/Possible choledocholithiasis. CT scan 11PM 09/18/17:  Extensive inflammatory changes about the gallbladder,concerning for acute cholecystitis. Scattered internal hyperdensity suspicious for cholelithiasis. Mild intra and extrahepatic biliary dilatation, with possible obstructive stone within the distal common bile duct. [place] 1/4 1AM: acute cholecystitis and cholelithiasis. Mild bile duct dilatation. No common duct stones are visualized but the distal common bile duct is obscured by overlying bowel gas and is indeterminate.  Elevated bilirubin Mild renal insufficiency - creatinine 1.5 range  Hypothyroid - synthroid at home  FEN: IV fluids/n.p.o. ID: Zosyn 09/19/17 81 DVT: SCDs - awaiting GI evaluation for ERCP Foley: None Follow-up: TBD  Plan:  GI to see, continue IV fluids and NPO, recheck labs in AM.       LOS: 0 days    James Owen 09/19/2017 [phone]

## 2017-09-20 DIAGNOSIS — I481 Persistent atrial fibrillation: Secondary | ICD-10-CM

## 2017-09-20 LAB — CBC
HEMATOCRIT: 44.6 % (ref 39.0–52.0)
Hemoglobin: 14.9 g/dL (ref 13.0–17.0)
MCH: 30.9 pg (ref 26.0–34.0)
MCHC: 33.4 g/dL (ref 30.0–36.0)
MCV: 92.5 fL (ref 78.0–100.0)
Platelets: 170 10*3/uL (ref 150–400)
RBC: 4.82 MIL/uL (ref 4.22–5.81)
RDW: 13.1 % (ref 11.5–15.5)
WBC: 9.1 10*3/uL (ref 4.0–10.5)

## 2017-09-20 LAB — COMPREHENSIVE METABOLIC PANEL
ALBUMIN: 2.7 g/dL — AB (ref 3.5–5.0)
ALK PHOS: 57 U/L (ref 38–126)
ALT: 24 U/L (ref 17–63)
ANION GAP: 7 (ref 5–15)
AST: 37 U/L (ref 15–41)
BILIRUBIN TOTAL: 1.1 mg/dL (ref 0.3–1.2)
BUN: 30 mg/dL — AB (ref 6–20)
CALCIUM: 8.2 mg/dL — AB (ref 8.9–10.3)
CO2: 25 mmol/L (ref 22–32)
Chloride: 107 mmol/L (ref 101–111)
Creatinine, Ser: 1.51 mg/dL — ABNORMAL HIGH (ref 0.61–1.24)
GFR calc Af Amer: 51 mL/min — ABNORMAL LOW (ref >60)
GFR, EST NON AFRICAN AMERICAN: 44 mL/min — AB (ref >60)
GLUCOSE: 145 mg/dL — AB (ref 65–99)
Potassium: 4.2 mmol/L (ref 3.5–5.1)
Sodium: 139 mmol/L (ref 135–145)
TOTAL PROTEIN: 6.5 g/dL (ref 6.5–8.1)

## 2017-09-20 LAB — LIPASE, BLOOD: Lipase: 34 U/L (ref 11–51)

## 2017-09-20 MED ORDER — LEVOTHYROXINE SODIUM 50 MCG PO TABS
50.0000 ug | ORAL_TABLET | Freq: Every day | ORAL | Status: DC
  Administered 2017-09-21: 50 ug via ORAL
  Filled 2017-09-20: qty 1

## 2017-09-20 MED ORDER — AMPICILLIN-SULBACTAM SODIUM 3 (2-1) G IJ SOLR
3.0000 g | Freq: Four times a day (QID) | INTRAMUSCULAR | Status: DC
  Administered 2017-09-20 – 2017-09-21 (×4): 3 g via INTRAVENOUS
  Filled 2017-09-20 (×5): qty 3

## 2017-09-20 MED ORDER — AMIODARONE IV BOLUS ONLY 150 MG/100ML
150.0000 mg | Freq: Once | INTRAVENOUS | Status: AC
  Administered 2017-09-20: 150 mg via INTRAVENOUS
  Filled 2017-09-20: qty 100

## 2017-09-20 MED ORDER — METOPROLOL TARTRATE 25 MG PO TABS
25.0000 mg | ORAL_TABLET | Freq: Four times a day (QID) | ORAL | Status: DC
  Administered 2017-09-20 – 2017-09-21 (×4): 25 mg via ORAL
  Filled 2017-09-20 (×4): qty 1

## 2017-09-20 MED ORDER — LEVOTHYROXINE SODIUM 25 MCG PO TABS: 25.0000 ug | ORAL_TABLET | Freq: Every day | ORAL | Status: DC

## 2017-09-20 NOTE — Progress Notes (Signed)
Progress Note for [facility]  Subjective: Feeling well.  No complaints.    Objective: Vital signs in last 24 hours: Temp:  [97.5 F (36.4 C)-99.1 F (37.3 C)] 98.1 F (36.7 C) (01/05 0800) Pulse Rate:  [51-115] 110 (01/05 1400) Resp:  [12-32] 23 (01/05 1400) BP: (100-149)/(74-95) 118/76 (01/05 1400) SpO2:  [93 %-100 %] 94 % (01/05 1400) Last BM Date: (PTA)  Intake/Output from previous day: 01/04 0701 - 01/05 0700 In: 1100 [I.V.:1000; IV Piggyback:100] Out: 500 [Urine:450; Blood:50] Intake/Output this shift: Total I/O In: -  Out: 600 [Urine:600]  General appearance: alert and no distress GI: soft, non-tender; bowel sounds normal; no masses,  no organomegaly  Lab Results: Recent Labs    09/18/17 2053 09/19/17 0517 09/20/17 0256  WBC 13.9* 13.6* 9.1  HGB 15.7 14.8 14.9  HCT 46.6 43.9 44.6  PLT 174 159 170   BMET Recent Labs    09/18/17 2053 09/19/17 0517 09/20/17 0256  NA 133* 135 139  K 4.0 4.1 4.2  CL 103 107 107  CO2 22 22 25   GLUCOSE 135* 120* 145*  BUN 25* 26* 30*  CREATININE 1.51* 1.53* 1.51*  CALCIUM 8.8* 8.5* 8.2*   LFT Recent Labs    09/20/17 0256  PROT 6.5  ALBUMIN 2.7*  AST 37  ALT 24  ALKPHOS 57  BILITOT 1.1   PT/INR No results for input(s): LABPROT, INR in the last 72 hours. Hepatitis Panel No results for input(s): HEPBSAG, HCVAB, HEPAIGM, HEPBIGM in the last 72 hours. C-Diff No results for input(s): CDIFFTOX in the last 72 hours. Fecal Lactopherrin No results for input(s): FECLLACTOFRN in the last 72 hours.  Studies/Results: Ct Abdomen Pelvis W Contrast  Result Date: 09/19/2017 CLINICAL DATA:  Initial evaluation for acute right lower quadrant pain. EXAM: CT ABDOMEN AND PELVIS WITH CONTRAST TECHNIQUE: Multidetector CT imaging of the abdomen and pelvis was performed using the standard protocol following bolus administration of intravenous contrast. CONTRAST:  173mL ISOVUE-300 IOPAMIDOL (ISOVUE-300) INJECTION 61% COMPARISON:  None  available. FINDINGS: Lower chest: Small bibasilar pleural effusions with associated atelectasis. Small pericardial fusion noted as well. Hepatobiliary: Hyperemia noted within the hepatic parenchyma about the gallbladder fossa. Liver otherwise unremarkable. Gallbladder enlarged with associated wall thickening, mucosal enhancement, and pericholecystic fat stranding, suspicious for acute cholecystitis. Scattered internal hyperdensity likely reflects stones. Common bile duct dilated up to 1 cm. Subtle soft tissue density within the distal common bile duct suspicious for possible obstructive choledocholithiasis (series 5, image 47). Mild central intrahepatic biliary dilatation noted as well. Pancreas: Pancreas within normal limits. No pancreatic ductal dilatation or mass lesion. Spleen: Spleen within normal limits. Adrenals/Urinary Tract: Adrenal glands are normal. Kidneys demonstrate fairly symmetric enhancement. Scattered bilateral renal hypodensities, some of which are too small to characterize, but statistically most likely reflect cysts. Prominent parapelvic cysts at the left kidney. 7 mm nonobstructive calculus. No other radiopaque calculi. No focal enhancing renal mass. No hydroureter. Bladder within normal limits. Stomach/Bowel: Stomach within normal limits. Mild wall thickening about the duodenum related to the adjacent inflammatory process within the gallbladder. Similarly, mild wall thickening about the colon at the level of the hepatic flexure also likely reactive. No evidence for obstruction. Appendix is normal. Colonic diverticulosis without evidence for acute diverticulitis. No other acute inflammatory changes about the bowels. Vascular/Lymphatic: Normal intravascular enhancement seen throughout the intra-abdominal aorta. Mesenteric vessels patent proximally. No adenopathy. Reproductive: Normal prostate. Other: No free air or fluid. Small fat containing paraumbilical hernia noted. Musculoskeletal: No acute  osseus  abnormality. No worrisome lytic or blastic osseous lesions. Prominent facet arthropathy noted within the lower lumbar spine. Diffusely flowing bulky osteophytic spurring suggestive of DISH. IMPRESSION: 1. Extensive inflammatory changes about the gallbladder, concerning for acute cholecystitis. Scattered internal hyperdensity suspicious for cholelithiasis. Mild intra and extrahepatic biliary dilatation, with possible obstructive stone within the distal common bile duct as above. 2. No other acute intra-abdominopelvic process.  Normal appendix. 3. Diverticulosis without evidence for acute diverticulitis. 4. 7 mm nonobstructive left renal nephrolithiasis. Electronically Signed   By: Jeannine Boga M.D.   On: 09/19/2017 00:11   US Abdomen Limited Ruq  Result Date: 09/19/2017 CLINICAL DATA:  Right upper quadrant pain for 4 days. Nausea and vomiting. Cholecystitis seen on CT. EXAM: ULTRASOUND ABDOMEN LIMITED RIGHT UPPER QUADRANT COMPARISON:  CT abdomen and pelvis 09/18/2017 FINDINGS: Gallbladder: Cholelithiasis with multiple stones in the gallbladder, largest measuring about 1.3 cm diameter. Gallbladder is distended and the gallbladder wall is thickened. Murphy's sign is positive. There is pericholecystic edema and mild gallbladder sludge. Common bile duct: Diameter: 7.9 mm, mildly dilated. Mild intrahepatic bile duct dilatation. No common duct stones are seen but the distal common bile duct is obscured by bowel gas. Liver: No focal lesion identified. Within normal limits in parenchymal echogenicity. Portal vein is patent on color Doppler imaging with normal direction of blood flow towards the liver. IMPRESSION: Described changes are consistent with acute cholecystitis and cholelithiasis. Mild bile duct dilatation. No common duct stones are visualized but the distal common bile duct is obscured by overlying bowel gas and is indeterminate. Electronically Signed   By: Lucienne Capers M.D.   On: 09/19/2017  02:04    Medications:  Scheduled: . acetaminophen  1,000 mg Oral Q6H  . docusate sodium  200 mg Oral BID  . heparin injection (subcutaneous)  5,000 Units Subcutaneous Q8H  . [START ON 09/21/2017] levothyroxine  50 mcg Oral QAC breakfast  . mouth rinse  15 mL Mouth Rinse BID  . metoprolol tartrate  25 mg Oral Q6H   Continuous: . ampicillin-sulbactam (UNASYN) IV Stopped (09/20/17 1143)  . lactated ringers 10 mL/hr at 09/20/17 1019    Assessment/Plan: 1) S/p gangrenous cholecystitis. 2) New onset afib.   The patient is well clinically.  His TB has normalized.  The plan from the GI standpoint was to pursue an MRCP before his discharge, however, with his clinical improvement and the normalized TB, the imaging scan will not be pursued.  The patient also prefers to wait and see.  Also, he may need to be in the hospital a little while longer with his new onset afib.  Plan: 1) Follow liver panel. 2) If enzymes/TB increase, then he will need an MRCP.  LOS: 1 day   Basya Casavant D 09/20/2017, 4:37 PM

## 2017-09-20 NOTE — Progress Notes (Signed)
1 Day Post-Op   Subjective/Chief Complaint: New onset atrial fibrillation post op.  Otherwise doing well.  T bili down.     Objective: Vital signs in last 24 hours: Temp:  [97.5 F (36.4 C)-100.4 F (38 C)] 98.1 F (36.7 C) (01/05 0800) Pulse Rate:  [56-115] 110 (01/05 0800) Resp:  [12-32] 15 (01/05 0800) BP: (100-138)/(72-90) 115/86 (01/05 0800) SpO2:  [93 %-100 %] 95 % (01/05 0800) Weight:  [91.2 kg (201 lb)] 91.2 kg (201 lb) (01/04 1154) Last BM Date: (PTA)  Intake/Output from previous day: 01/04 0701 - 01/05 0700 In: 1100 [I.V.:1000; IV Piggyback:100] Out: 500 [Urine:450; Blood:50] Intake/Output this shift: No intake/output data recorded.  General appearance: alert, cooperative and no distress Eyes: sclera anicteric Resp: breathing comfortably Cardio: irregularly irregular rhythm GI: soft, non distended, wounds c/d/i. Extremities: extremities normal, atraumatic, no cyanosis or edema  Lab Results:  Recent Labs    09/19/17 0517 09/20/17 0256  WBC 13.6* 9.1  HGB 14.8 14.9  HCT 43.9 44.6  PLT 159 170   BMET Recent Labs    09/19/17 0517 09/20/17 0256  NA 135 139  K 4.1 4.2  CL 107 107  CO2 22 25  GLUCOSE 120* 145*  BUN 26* 30*  CREATININE 1.53* 1.51*  CALCIUM 8.5* 8.2*   PT/INR No results for input(s): LABPROT, INR in the last 72 hours. ABG No results for input(s): PHART, HCO3 in the last 72 hours.  Invalid input(s): PCO2, PO2  Studies/Results: Ct Abdomen Pelvis W Contrast  Result Date: 09/19/2017 CLINICAL DATA:  Initial evaluation for acute right lower quadrant pain. EXAM: CT ABDOMEN AND PELVIS WITH CONTRAST TECHNIQUE: Multidetector CT imaging of the abdomen and pelvis was performed using the standard protocol following bolus administration of intravenous contrast. CONTRAST:  159mL ISOVUE-300 IOPAMIDOL (ISOVUE-300) INJECTION 61% COMPARISON:  None available. FINDINGS: Lower chest: Small bibasilar pleural effusions with associated atelectasis. Small  pericardial fusion noted as well. Hepatobiliary: Hyperemia noted within the hepatic parenchyma about the gallbladder fossa. Liver otherwise unremarkable. Gallbladder enlarged with associated wall thickening, mucosal enhancement, and pericholecystic fat stranding, suspicious for acute cholecystitis. Scattered internal hyperdensity likely reflects stones. Common bile duct dilated up to 1 cm. Subtle soft tissue density within the distal common bile duct suspicious for possible obstructive choledocholithiasis (series 5, image 47). Mild central intrahepatic biliary dilatation noted as well. Pancreas: Pancreas within normal limits. No pancreatic ductal dilatation or mass lesion. Spleen: Spleen within normal limits. Adrenals/Urinary Tract: Adrenal glands are normal. Kidneys demonstrate fairly symmetric enhancement. Scattered bilateral renal hypodensities, some of which are too small to characterize, but statistically most likely reflect cysts. Prominent parapelvic cysts at the left kidney. 7 mm nonobstructive calculus. No other radiopaque calculi. No focal enhancing renal mass. No hydroureter. Bladder within normal limits. Stomach/Bowel: Stomach within normal limits. Mild wall thickening about the duodenum related to the adjacent inflammatory process within the gallbladder. Similarly, mild wall thickening about the colon at the level of the hepatic flexure also likely reactive. No evidence for obstruction. Appendix is normal. Colonic diverticulosis without evidence for acute diverticulitis. No other acute inflammatory changes about the bowels. Vascular/Lymphatic: Normal intravascular enhancement seen throughout the intra-abdominal aorta. Mesenteric vessels patent proximally. No adenopathy. Reproductive: Normal prostate. Other: No free air or fluid. Small fat containing paraumbilical hernia noted. Musculoskeletal: No acute osseus abnormality. No worrisome lytic or blastic osseous lesions. Prominent facet arthropathy noted  within the lower lumbar spine. Diffusely flowing bulky osteophytic spurring suggestive of DISH. IMPRESSION: 1. Extensive inflammatory changes about the  gallbladder, concerning for acute cholecystitis. Scattered internal hyperdensity suspicious for cholelithiasis. Mild intra and extrahepatic biliary dilatation, with possible obstructive stone within the distal common bile duct as above. 2. No other acute intra-abdominopelvic process.  Normal appendix. 3. Diverticulosis without evidence for acute diverticulitis. 4. 7 mm nonobstructive left renal nephrolithiasis. Electronically Signed   By: Jeannine Boga M.D.   On: 09/19/2017 00:11   US Abdomen Limited Ruq  Result Date: 09/19/2017 CLINICAL DATA:  Right upper quadrant pain for 4 days. Nausea and vomiting. Cholecystitis seen on CT. EXAM: ULTRASOUND ABDOMEN LIMITED RIGHT UPPER QUADRANT COMPARISON:  CT abdomen and pelvis 09/18/2017 FINDINGS: Gallbladder: Cholelithiasis with multiple stones in the gallbladder, largest measuring about 1.3 cm diameter. Gallbladder is distended and the gallbladder wall is thickened. Murphy's sign is positive. There is pericholecystic edema and mild gallbladder sludge. Common bile duct: Diameter: 7.9 mm, mildly dilated. Mild intrahepatic bile duct dilatation. No common duct stones are seen but the distal common bile duct is obscured by bowel gas. Liver: No focal lesion identified. Within normal limits in parenchymal echogenicity. Portal vein is patent on color Doppler imaging with normal direction of blood flow towards the liver. IMPRESSION: Described changes are consistent with acute cholecystitis and cholelithiasis. Mild bile duct dilatation. No common duct stones are visualized but the distal common bile duct is obscured by overlying bowel gas and is indeterminate. Electronically Signed   By: Lucienne Capers M.D.   On: 09/19/2017 02:04    Anti-infectives: Anti-infectives (From admission, onward)   Start     Dose/Rate Route  Frequency Ordered Stop   09/20/17 0930  Ampicillin-Sulbactam (UNASYN) 3 g in sodium chloride 0.9 % 100 mL IVPB     3 g 200 mL/hr over 30 Minutes Intravenous Every 6 hours 09/20/17 0919 09/22/17 0929   09/19/17 1336  piperacillin-tazobactam (ZOSYN) 3.375 GM/50ML IVPB    Comments:  Bridget Hartshorn   : cabinet override      09/19/17 1336 09/19/17 1446   09/19/17 0515  piperacillin-tazobactam (ZOSYN) IVPB 3.375 g  Status:  Discontinued     3.375 g 12.5 mL/hr over 240 Minutes Intravenous Every 8 hours 09/19/17 0508 09/19/17 1602      Assessment/Plan: s/p Procedure(s): LAPAROSCOPIC CHOLECYSTECTOMY (N/A) gangrenous cholecystitis.  48 hours IV antibiotics Cards consult for new onset atrial fibrillation Advance diet Transfer to tele floor if no new issues with cards.   LOS: 1 day    Stark Klein 09/20/2017

## 2017-09-20 NOTE — Consult Note (Signed)
ELECTROPHYSIOLOGY CONSULT NOTE  Patient ID: James Owen, MRN: [id number], DOB/AGE: Nov 16, 1945 71 y.o. Admit date: 09/18/2017 Date of Consult: 09/20/2017  Primary Physician: [facility]  Primary Cardiologist: Graylon Gunning James Owen is a 72 y.o. male who is being seen today for the evaluation of atrial fibrillation  at the request of Dr. Barry Dienes.   Chief Complaint: Atrial fibrillation   HPI James Owen is a 72 y.o. male  Admitted with gangrenous cholecystitis who underwent laparoscopic cholecystectomy 09/19/17. No data available prior to the OR, but on arrival in the OR and prior to surgery notes report that he was in atrial fibrillation.  Before surgery and afterwards he has had no palpitations.  The week before surgery he was active without restriction.  This morning when he went to ambulate his heart rate accelerated into the 140-50s.  He was given a bolus of amiodarone.   Thromboembolic risk factors ( age -25) for a CHADSVASc Score of 1   Past Medical History:  Diagnosis Date  . Thyroid disease       Surgical History:  Past Surgical History:  Procedure Laterality Date  . MULTIPLE TOOTH EXTRACTIONS     has all dentures     Home Meds: Prior to Admission medications   Medication Sig Start Date End Date Taking? Authorizing Provider  levothyroxine (SYNTHROID, LEVOTHROID) 50 MCG tablet Take 50 mcg by mouth daily before breakfast.   Yes [provider]    Inpatient Medications:  . acetaminophen  1,000 mg Oral Q6H  . docusate sodium  200 mg Oral BID  . heparin injection (subcutaneous)  5,000 Units Subcutaneous Q8H  . [START ON 09/21/2017] levothyroxine  50 mcg Oral QAC breakfast  . mouth rinse  15 mL Mouth Rinse BID     Allergies: No Known Allergies  Social History   Socioeconomic History  . Marital status: Married    Spouse name: Not on file  . Number of children: Not on file  . Years of education: Not on file  . Highest  education level: Not on file  Social Needs  . Financial resource strain: Not on file  . Food insecurity - worry: Not on file  . Food insecurity - inability: Not on file  . Transportation needs - medical: Not on file  . Transportation needs - non-medical: Not on file  Occupational History  . Not on file  Tobacco Use  . Smoking status: Never Smoker  . Smokeless tobacco: Current User    Types: Chew  . Tobacco comment: occasionally  Substance and Sexual Activity  . Alcohol use: Yes    Comment: SELDOM  . Drug use: No  . Sexual activity: Not on file  Other Topics Concern  . Not on file  Social History Narrative  . Not on file     Family History  Problem Relation Age of Onset  . Melanoma Sister   . Breast cancer Sister      ROS:  Please see the history of present illness.     All other systems reviewed and negative.    Physical Exam: Blood pressure 115/86, pulse (!) 110, temperature 98.1 F (36.7 C), temperature source Oral, resp. rate 15, height 6' (1.829 m), weight 201 lb (91.2 kg), SpO2 95 %. General: Well developed, well nourished male in no acute distress. Head: Normocephalic, atraumatic, sclera non-icteric, no xanthomas, nares are without discharge. EENT: normal Lymph Nodes:  none Back: without scoliosis/kyphosis, no CVA tendersness Neck: Negative for carotid bruits.  JVD not elevated. Lungs: Clear bilaterally to auscultation without wheezes, rales, or rhonchi. Breathing is unlabored. Heart: Irregular rate and rhythm without murmur , rubs, or gallops appreciated. Abdomen: Soft, non-tender, non-distended with normoactive bowel sounds. No hepatomegaly. No rebound/guarding. No obvious abdominal masses. Msk:  Strength and tone appear normal for age. Extremities: No clubbing or cyanosis. No edema.  Distal pedal pulses are 2+ and equal bilaterally. Skin: Warm and Dry Neuro: Alert and oriented X 3. CN III-XII intact Grossly normal sensory and motor function . Psych:  Responds  to questions appropriately with a normal affect.      Labs: Cardiac Enzymes No results for input(s): CKTOTAL, CKMB, TROPONINI in the last 72 hours. CBC Lab Results  Component Value Date   WBC 9.1 09/20/2017   HGB 14.9 09/20/2017   HCT 44.6 09/20/2017   MCV 92.5 09/20/2017   PLT 170 09/20/2017   PROTIME: No results for input(s): LABPROT, INR in the last 72 hours. Chemistry  Recent Labs  Lab 09/20/17 0256  NA 139  K 4.2  CL 107  CO2 25  BUN 30*  CREATININE 1.51*  CALCIUM 8.2*  PROT 6.5  BILITOT 1.1  ALKPHOS 57  ALT 24  AST 37  GLUCOSE 145*   Lipids No results found for: CHOL, HDL, LDLCALC, TRIG BNP No results found for: PROBNP Thyroid Function Tests: No results for input(s): TSH, T4TOTAL, T3FREE, THYROIDAB in the last 72 hours.  Invalid input(s): FREET3    Miscellaneous No results found for: DDIMER  Radiology/Studies:  Ct Abdomen Pelvis W Contrast  Result Date: 09/19/2017 CLINICAL DATA:  Initial evaluation for acute right lower quadrant pain. EXAM: CT ABDOMEN AND PELVIS WITH CONTRAST TECHNIQUE: Multidetector CT imaging of the abdomen and pelvis was performed using the standard protocol following bolus administration of intravenous contrast. CONTRAST:  166mL ISOVUE-300 IOPAMIDOL (ISOVUE-300) INJECTION 61% COMPARISON:  None available. FINDINGS: Lower chest: Small bibasilar pleural effusions with associated atelectasis. Small pericardial fusion noted as well. Hepatobiliary: Hyperemia noted within the hepatic parenchyma about the gallbladder fossa. Liver otherwise unremarkable. Gallbladder enlarged with associated wall thickening, mucosal enhancement, and pericholecystic fat stranding, suspicious for acute cholecystitis. Scattered internal hyperdensity likely reflects stones. Common bile duct dilated up to 1 cm. Subtle soft tissue density within the distal common bile duct suspicious for possible obstructive choledocholithiasis (series 5, image 47). Mild central  intrahepatic biliary dilatation noted as well. Pancreas: Pancreas within normal limits. No pancreatic ductal dilatation or mass lesion. Spleen: Spleen within normal limits. Adrenals/Urinary Tract: Adrenal glands are normal. Kidneys demonstrate fairly symmetric enhancement. Scattered bilateral renal hypodensities, some of which are too small to characterize, but statistically most likely reflect cysts. Prominent parapelvic cysts at the left kidney. 7 mm nonobstructive calculus. No other radiopaque calculi. No focal enhancing renal mass. No hydroureter. Bladder within normal limits. Stomach/Bowel: Stomach within normal limits. Mild wall thickening about the duodenum related to the adjacent inflammatory process within the gallbladder. Similarly, mild wall thickening about the colon at the level of the hepatic flexure also likely reactive. No evidence for obstruction. Appendix is normal. Colonic diverticulosis without evidence for acute diverticulitis. No other acute inflammatory changes about the bowels. Vascular/Lymphatic: Normal intravascular enhancement seen throughout the intra-abdominal aorta. Mesenteric vessels patent proximally. No adenopathy. Reproductive: Normal prostate. Other: No free air or fluid. Small fat containing paraumbilical hernia noted. Musculoskeletal: No acute osseus abnormality. No worrisome lytic or blastic osseous lesions. Prominent facet arthropathy noted within the lower lumbar spine. Diffusely flowing bulky osteophytic spurring suggestive of DISH.  IMPRESSION: 1. Extensive inflammatory changes about the gallbladder, concerning for acute cholecystitis. Scattered internal hyperdensity suspicious for cholelithiasis. Mild intra and extrahepatic biliary dilatation, with possible obstructive stone within the distal common bile duct as above. 2. No other acute intra-abdominopelvic process.  Normal appendix. 3. Diverticulosis without evidence for acute diverticulitis. 4. 7 mm nonobstructive left  renal nephrolithiasis. Electronically Signed   By: Jeannine Boga M.D.   On: 09/19/2017 00:11   US Abdomen Limited Ruq  Result Date: 09/19/2017 CLINICAL DATA:  Right upper quadrant pain for 4 days. Nausea and vomiting. Cholecystitis seen on CT. EXAM: ULTRASOUND ABDOMEN LIMITED RIGHT UPPER QUADRANT COMPARISON:  CT abdomen and pelvis 09/18/2017 FINDINGS: Gallbladder: Cholelithiasis with multiple stones in the gallbladder, largest measuring about 1.3 cm diameter. Gallbladder is distended and the gallbladder wall is thickened. Murphy's sign is positive. There is pericholecystic edema and mild gallbladder sludge. Common bile duct: Diameter: 7.9 mm, mildly dilated. Mild intrahepatic bile duct dilatation. No common duct stones are seen but the distal common bile duct is obscured by bowel gas. Liver: No focal lesion identified. Within normal limits in parenchymal echogenicity. Portal vein is patent on color Doppler imaging with normal direction of blood flow towards the liver. IMPRESSION: Described changes are consistent with acute cholecystitis and cholelithiasis. Mild bile duct dilatation. No common duct stones are visualized but the distal common bile duct is obscured by overlying bowel gas and is indeterminate. Electronically Signed   By: Lucienne Capers M.D.   On: 09/19/2017 02:04    EKG: Atrial fibrillation at a rate of 119 -09/24/38 nonspecific ST changes  Assessment and Plan:  Atrial fibrillation-persistent  Acute cholecystitis status post lap chole  Renal insufficiency grade 3  The duration of the atrial fibrillation is unknown. It is possible that it relates to his surgery and this has important long-term implications related to anticoagulation, but for now, we are stuck with the presumption that is long-standing.  Rate control be more difficult in the postoperative phase because of hyper adrenergic state  Blood pressure sufficient we will use oral beta-blockers and IV amiodarone is  backup  We will obtain an echocardiogram to look at LV function and LA size the latter of which may give [place] some idea as to the chronicity of his atrial fibrillation.  When okay with surgery, we will begin anticoagulation.  If atrial fibrillation persists and is symptomatic, TEE guided cardioversion can be undertaken otherwise a few weeks of anticoagulation and see if he reverts on his own would make sense.  The implications of its association with surgery to rise from data suggests that the risk benefit for anticoagulation in surgery/sepsis associated atrial fibrillation is quite different and significantly less in favor of anticoagulation long-term.      Virl Axe

## 2017-09-21 ENCOUNTER — Inpatient Hospital Stay (HOSPITAL_COMMUNITY): Payer: PPO

## 2017-09-21 LAB — ECHOCARDIOGRAM COMPLETE
HEIGHTINCHES: 72 in
WEIGHTICAEL: 3216 [oz_av]

## 2017-09-21 MED ORDER — METOPROLOL TARTRATE 25 MG PO TABS: 50.0000 mg | ORAL_TABLET | Freq: Two times a day (BID) | ORAL | Status: DC

## 2017-09-21 MED ORDER — AMOXICILLIN-POT CLAVULANATE 875-125 MG PO TABS
1.0000 | ORAL_TABLET | Freq: Two times a day (BID) | ORAL | Status: DC
  Administered 2017-09-21: 1 via ORAL
  Filled 2017-09-21: qty 1

## 2017-09-21 MED ORDER — METOPROLOL TARTRATE 25 MG PO TABS
25.0000 mg | ORAL_TABLET | Freq: Once | ORAL | Status: AC
  Administered 2017-09-21: 25 mg via ORAL
  Filled 2017-09-21: qty 1

## 2017-09-21 MED ORDER — DOCUSATE SODIUM 100 MG PO CAPS
100.0000 mg | ORAL_CAPSULE | Freq: Two times a day (BID) | ORAL | Status: DC
  Administered 2017-09-21: 100 mg via ORAL
  Filled 2017-09-21: qty 1

## 2017-09-21 MED ORDER — APIXABAN 5 MG PO TABS
5.0000 mg | ORAL_TABLET | Freq: Two times a day (BID) | ORAL | Status: DC
  Administered 2017-09-21: 5 mg via ORAL
  Filled 2017-09-21 (×2): qty 1

## 2017-09-21 MED ORDER — METOPROLOL TARTRATE 50 MG PO TABS: 50.0000 mg | ORAL_TABLET | Freq: Two times a day (BID) | ORAL | 0 refills | Status: DC

## 2017-09-21 MED ORDER — APIXABAN 5 MG PO TABS: 5.0000 mg | ORAL_TABLET | Freq: Two times a day (BID) | ORAL | 0 refills | Status: DC

## 2017-09-21 NOTE — Progress Notes (Signed)
Progress Note  Patient Name: James Owen Date of Encounter: 09/21/2017  Primary Cardiologist: new  Primary Electrophysiologist: SK   Patient Profile     72 y.o. male   Admitted with gangrenous cholecystitis who underwent laparoscopic cholecystectomy 09/19/17. Asked to see for Afib-which was evident on first recording ( anesthesia preop)     Subjective   Feels better this am but anxious to leave*  Inpatient Medications    Scheduled Meds: . acetaminophen  1,000 mg Oral Q6H  . docusate sodium  200 mg Oral BID  . heparin injection (subcutaneous)  5,000 Units Subcutaneous Q8H  . levothyroxine  50 mcg Oral QAC breakfast  . mouth rinse  15 mL Mouth Rinse BID  . metoprolol tartrate  25 mg Oral Q6H   Continuous Infusions: . ampicillin-sulbactam (UNASYN) IV 3 g (09/21/17 0500)  . lactated ringers 10 mL/hr at 09/20/17 2000   PRN Meds: alum & mag hydroxide-simeth, HYDROcodone-acetaminophen, metoprolol tartrate, ondansetron **OR** ondansetron (ZOFRAN) IV   Vital Signs    Vitals:   09/21/17 0400 09/21/17 0500 09/21/17 0600 09/21/17 0700  BP: (!) 125/94 127/83 131/85 (!) 147/110  Pulse: 83 88 89 (!) 38  Resp: 15 14 17 20   Temp: 97.9 F (36.6 C)     TempSrc: Oral     SpO2: 92% 93% 95% 96%  Weight:      Height:        Intake/Output Summary (Last 24 hours) at 09/21/2017 0804 Last data filed at 09/21/2017 0600 Gross per 24 hour  Intake 410 ml  Output 1020 ml  Net -610 ml   Filed Weights   09/19/17 1154  Weight: 201 lb (91.2 kg)    Telemetry    AFib HR 90-110 - Personally Reviewed  ECG     Physical Exam  Well developed and nourished in no acute distress HENT normal Neck supple with JVP-flat Carotids brisk and full without bruits Clear Irregularly irregular rate and rhythm with controlled*  ventricular response, no murmurs or gallops Abd-soft with active BS without hepatomegaly No Clubbing cyanosis edema Skin-warm and dry A & Oriented  Grossly normal  sensory and motor function   Labs    Chemistry Recent Labs  Lab 09/18/17 2053 09/19/17 0517 09/20/17 0256  NA 133* 135 139  K 4.0 4.1 4.2  CL 103 107 107  CO2 22 22 25   GLUCOSE 135* 120* 145*  BUN 25* 26* 30*  CREATININE 1.51* 1.53* 1.51*  CALCIUM 8.8* 8.5* 8.2*  PROT 6.9 6.3* 6.5  ALBUMIN 3.3* 3.0* 2.7*  AST 23 22 37  ALT 17 17 24   ALKPHOS 62 55 57  BILITOT 1.8* 2.2* 1.1  GFRNONAA 44* 43* 44*  GFRAA 51* 50* 51*  ANIONGAP 8 6 7      Hematology Recent Labs  Lab 09/18/17 2053 09/19/17 0517 09/20/17 0256  WBC 13.9* 13.6* 9.1  RBC 5.12 4.80 4.82  HGB 15.7 14.8 14.9  HCT 46.6 43.9 44.6  MCV 91.0 91.5 92.5  MCH 30.7 30.8 30.9  MCHC 33.7 33.7 33.4  RDW 12.8 13.1 13.1  PLT 174 159 170    Cardiac EnzymesNo results for input(s): TROPONINI in the last 168 hours. No results for input(s): TROPIPOC in the last 168 hours.   BNPNo results for input(s): BNP, PROBNP in the last 168 hours.   DDimer No results for input(s): DDIMER in the last 168 hours.   Radiology    No results found.  Cardiac Studies   Echo P  Assessment & Plan    Atrial fibrillation  CVR  CHADSVASc -1  (age)   S/p lap Chole   Tried to reach surgery on call yesterday, but no return call x 2.    Would like to initiate anticoagulation but will need surgery buy in  Spoke with Dr Laurence Slate-- ok to initiate anticoagulation  Will anticipate echo and AF CLINIC followup in 2 weeks with plans then for cardioversion   Signed, Virl Axe, MD  09/21/2017, 8:04 AM

## 2017-09-21 NOTE — Progress Notes (Signed)
Patient given d/c instructions and verbalizes understanding. Patient with no complaints at the current time. Will d/c to private vehicle via [place].

## 2017-09-21 NOTE — Progress Notes (Signed)
2 Days Post-Op   Subjective/Chief Complaint: Tolerated reg diet.  Had HR up to 170s yesterday.  Got IV amio bolus and rate came back down.  Did not convert to sinus.  Frustrated.  Has not needed any narcotic pain medication.  Passing gas.     Objective: Vital signs in last 24 hours: Temp:  [97.6 F (36.4 C)-98.2 F (36.8 C)] 97.6 F (36.4 C) (01/06 0800) Pulse Rate:  [38-110] 90 (01/06 0800) Resp:  [12-26] 22 (01/06 0800) BP: (103-149)/(76-112) 131/100 (01/06 0800) SpO2:  [92 %-96 %] 96 % (01/06 0800) Last BM Date: (PTA)  Intake/Output from previous day: 01/05 0701 - 01/06 0700 In: 410 [I.V.:110; IV Piggyback:300] Out: 1020 [Urine:1020] Intake/Output this shift: Total I/O In: 120 [I.V.:20; IV Piggyback:100] Out: -   General appearance: alert, cooperative and no distress Eyes: sclera anicteric Resp: breathing comfortably Cardio: irregularly irregular rhythm GI: soft, mildly distended, wounds c/d/i. Extremities: extremities normal, atraumatic, no cyanosis or edema  Lab Results:  Recent Labs    09/19/17 0517 09/20/17 0256  WBC 13.6* 9.1  HGB 14.8 14.9  HCT 43.9 44.6  PLT 159 170   BMET Recent Labs    09/19/17 0517 09/20/17 0256  NA 135 139  K 4.1 4.2  CL 107 107  CO2 22 25  GLUCOSE 120* 145*  BUN 26* 30*  CREATININE 1.53* 1.51*  CALCIUM 8.5* 8.2*   PT/INR No results for input(s): LABPROT, INR in the last 72 hours. ABG No results for input(s): PHART, HCO3 in the last 72 hours.  Invalid input(s): PCO2, PO2  Studies/Results: No results found.  Anti-infectives: Anti-infectives (From admission, onward)   Start     Dose/Rate Route Frequency Ordered Stop   09/21/17 1000  amoxicillin-clavulanate (AUGMENTIN) 875-125 MG per tablet 1 tablet     1 tablet Oral Every 12 hours 09/21/17 0858 10/01/17 0959   09/20/17 1000  Ampicillin-Sulbactam (UNASYN) 3 g in sodium chloride 0.9 % 100 mL IVPB  Status:  Discontinued     3 g 200 mL/hr over 30 Minutes Intravenous  Every 6 hours 09/20/17 0919 09/21/17 0858   09/19/17 1336  piperacillin-tazobactam (ZOSYN) 3.375 GM/50ML IVPB    Comments:  Bridget Hartshorn   : cabinet override      09/19/17 1336 09/19/17 1446   09/19/17 0515  piperacillin-tazobactam (ZOSYN) IVPB 3.375 g  Status:  Discontinued     3.375 g 12.5 mL/hr over 240 Minutes Intravenous Every 8 hours 09/19/17 0508 09/19/17 1602      Assessment/Plan: s/p Procedure(s): LAPAROSCOPIC CHOLECYSTECTOMY (N/A) gangrenous cholecystitis.  D/c antibiotics Home after echo Diet as tolerated. Metoprolol for rate controlled.   Discussed s/s of rapid a fib and of gallbladder complications.     LOS: 2 days    James Owen 09/21/2017

## 2017-09-21 NOTE — Progress Notes (Signed)
Progress Note  Patient Name: James Owen Date of Encounter: 09/21/2017  Primary Cardiologist: new  Primary Electrophysiologist: SK   Patient Profile     72 y.o. male   Admitted with gangrenous cholecystitis who underwent laparoscopic cholecystectomy 09/19/17. Asked to see for Afib-which was evident on first recording ( anesthesia preop)     Subjective   Feels better  Exceedingly frustrated  Inpatient Medications    Scheduled Meds: . acetaminophen  1,000 mg Oral Q6H  . docusate sodium  200 mg Oral BID  . heparin injection (subcutaneous)  5,000 Units Subcutaneous Q8H  . levothyroxine  50 mcg Oral QAC breakfast  . mouth rinse  15 mL Mouth Rinse BID  . metoprolol tartrate  25 mg Oral Q6H   Continuous Infusions: . ampicillin-sulbactam (UNASYN) IV 3 g (09/21/17 0500)  . lactated ringers 10 mL/hr at 09/20/17 2000   PRN Meds: alum & mag hydroxide-simeth, HYDROcodone-acetaminophen, metoprolol tartrate, ondansetron **OR** ondansetron (ZOFRAN) IV   Vital Signs    Vitals:   09/21/17 0500 09/21/17 0600 09/21/17 0700 09/21/17 0800  BP: 127/83 131/85 (!) 147/110 (!) 131/100  Pulse: 88 89 (!) 38 90  Resp: 14 17 20  (!) 22  Temp:    97.6 F (36.4 C)  TempSrc:    Oral  SpO2: 93% 95% 96% 96%  Weight:      Height:        Intake/Output Summary (Last 24 hours) at 09/21/2017 0828 Last data filed at 09/21/2017 0800 Gross per 24 hour  Intake 530 ml  Output 1020 ml  Net -490 ml   Filed Weights   09/19/17 1154  Weight: 201 lb (91.2 kg)    Telemetry    AFib HR 90-110 - Personally Reviewed  ECG     Physical Exam  Well developed and nourished in no acute distress HENT normal Neck supple with JVP-flat Clear Irregularly irregular rate and rhythm with controlled  ventricular response, no murmurs or gallops Abd-soft with active BS without hepatomegaly No Clubbing cyanosis edema Skin-warm and dry A & Oriented  Grossly normal sensory and motor function   Labs    Chemistry Recent Labs  Lab 09/18/17 2053 09/19/17 0517 09/20/17 0256  NA 133* 135 139  K 4.0 4.1 4.2  CL 103 107 107  CO2 22 22 25   GLUCOSE 135* 120* 145*  BUN 25* 26* 30*  CREATININE 1.51* 1.53* 1.51*  CALCIUM 8.8* 8.5* 8.2*  PROT 6.9 6.3* 6.5  ALBUMIN 3.3* 3.0* 2.7*  AST 23 22 37  ALT 17 17 24   ALKPHOS 62 55 57  BILITOT 1.8* 2.2* 1.1  GFRNONAA 44* 43* 44*  GFRAA 51* 50* 51*  ANIONGAP 8 6 7      Hematology Recent Labs  Lab 09/18/17 2053 09/19/17 0517 09/20/17 0256  WBC 13.9* 13.6* 9.1  RBC 5.12 4.80 4.82  HGB 15.7 14.8 14.9  HCT 46.6 43.9 44.6  MCV 91.0 91.5 92.5  MCH 30.7 30.8 30.9  MCHC 33.7 33.7 33.4  RDW 12.8 13.1 13.1  PLT 174 159 170    Cardiac EnzymesNo results for input(s): TROPONINI in the last 168 hours. No results for input(s): TROPIPOC in the last 168 hours.   BNPNo results for input(s): BNP, PROBNP in the last 168 hours.   DDimer No results for input(s): DDIMER in the last 168 hours.   Radiology    No results found.  Cardiac Studies   Echo P     Assessment & Plan    Atrial  fibrillation  CVR  CHADSVASc -1  (age)   S/p lap Chole  Renal insufficiency Gd 3   Tried to reach surgery on call yesterday, but no return call x 2.    Would like to initiate anticoagulation but will need surgery buy in--would use apixoban 5 bid  OK to go home from my perspective-- if echo can not be done as inpt can be arranged as outpt  followup afib clinic 2 weeks ( I will send message)  Change metoprolol to 50 bid   Signed, Virl Axe, MD  09/21/2017, 8:28 AM

## 2017-09-21 NOTE — Discharge Instructions (Addendum)
Laparoscopic Cholecystectomy, Care After °This sheet gives you information about how to care for yourself after your procedure. Your health care provider may also give you more specific instructions. If you have problems or questions, contact your health care provider. °What can I expect after the procedure? °After the procedure, it is common to have: °· Pain at your incision sites. You will be given medicines to control this pain. °· Mild nausea or vomiting. °· Bloating and possible shoulder pain from the air-like gas that was used during the procedure. ° °Follow these instructions at home: °Incision care ° °· Follow instructions from your health care provider about how to take care of your incisions. Make sure you: °? Wash your hands with soap and water before you change your bandage (dressing). If soap and water are not available, use hand sanitizer. °? Change your dressing as told by your health care provider. °? Leave stitches (sutures), skin glue, or adhesive strips in place. These skin closures may need to be in place for 2 weeks or longer. If adhesive strip edges start to loosen and curl up, you may trim the loose edges. Do not remove adhesive strips completely unless your health care provider tells you to do that. °· Do not take baths, swim, or use a hot tub until your health care provider approves. Ask your health care provider if you can take showers. You may only be allowed to take sponge baths for bathing. °· Check your incision area every day for signs of infection. Check for: °? More redness, swelling, or pain. °? More fluid or blood. °? Warmth. °? Pus or a bad smell. °Activity °· Do not drive or use heavy machinery while taking prescription pain medicine. °· Do not lift anything that is heavier than 10 lb (4.5 kg) until your health care provider approves. °· Do not play contact sports until your health care provider approves. °· Do not drive for 24 hours if you were given a medicine to help you relax  (sedative). °· Rest as needed. Do not return to work or school until your health care provider approves. °General instructions °· Take over-the-counter and prescription medicines only as told by your health care provider. °· To prevent or treat constipation while you are taking prescription pain medicine, your health care provider may recommend that you: °? Drink enough fluid to keep your urine clear or pale yellow. °? Take over-the-counter or prescription medicines. °? Eat foods that are high in fiber, such as fresh fruits and vegetables, whole grains, and beans. °? Limit foods that are high in fat and processed sugars, such as fried and sweet foods. °Contact a health care provider if: °· You develop a rash. °· You have more redness, swelling, or pain around your incisions. °· You have more fluid or blood coming from your incisions. °· Your incisions feel warm to the touch. °· You have pus or a bad smell coming from your incisions. °· You have a fever. °· One or more of your incisions breaks open. °Get help right away if: °· You have trouble breathing. °· You have chest pain. °· You have increasing pain in your shoulders. °· You faint or feel dizzy when you stand. °· You have severe pain in your abdomen. °· You have nausea or vomiting that lasts for more than one day. °· You have leg pain. °This information is not intended to replace advice given to you by your health care provider. Make sure you discuss any questions you   have with your health care provider. °Document Released: 09/02/2005 Document Revised: 03/23/2016 Document Reviewed: 02/19/2016 °Elsevier Interactive Patient Education © 2018 Elsevier Inc. ° ° °CCS ______[facility] °LAPAROSCOPIC SURGERY: POST OP INSTRUCTIONS °Always review your discharge instruction sheet given to you by the facility where your surgery was performed. °IF YOU HAVE DISABILITY OR FAMILY LEAVE FORMS, YOU MUST BRING THEM TO THE OFFICE FOR PROCESSING.   °DO NOT GIVE  THEM TO YOUR DOCTOR. ° °1. A prescription for pain medication may be given to you upon discharge.  Take your pain medication as prescribed, if needed.  If narcotic pain medicine is not needed, then you may take acetaminophen (Tylenol) or ibuprofen (Advil) as needed. °2. Take your usually prescribed medications unless otherwise directed. °3. If you need a refill on your pain medication, please contact your pharmacy.  They will contact our office to request authorization. Prescriptions will not be filled after 5pm or on week-ends. °4. You should follow a light diet the first few days after arrival home, such as soup and crackers, etc.  Be sure to include lots of fluids daily. °5. Most patients will experience some swelling and bruising in the area of the incisions.  Ice packs will help.  Swelling and bruising can take several days to resolve.  °6. It is common to experience some constipation if taking pain medication after surgery.  Increasing fluid intake and taking a stool softener (such as Colace) will usually help or prevent this problem from occurring.  A mild laxative (Milk of Magnesia or Miralax) should be taken according to package instructions if there are no bowel movements after 48 hours. °7. Unless discharge instructions indicate otherwise, you may remove your bandages 24-48 hours after surgery, and you may shower at that time.  You may have steri-strips (small skin tapes) in place directly over the incision.  These strips should be left on the skin for 7-10 days.  If your surgeon used skin glue on the incision, you may shower in 24 hours.  The glue will flake off over the next 2-3 weeks.  Any sutures or staples will be removed at the office during your follow-up visit. °8. ACTIVITIES:  You may resume regular (light) daily activities beginning the next day--such as daily self-care, walking, climbing stairs--gradually increasing activities as tolerated.  You may have sexual intercourse when it is  comfortable.  Refrain from any heavy lifting or straining until approved by your doctor. °a. You may drive when you are no longer taking prescription pain medication, you can comfortably wear a seatbelt, and you can safely maneuver your car and apply brakes. °b. RETURN TO WORK:  __________________________________________________________ °9. You should see your doctor in the office for a follow-up appointment approximately 2-3 weeks after your surgery.  Make sure that you call for this appointment within a day or two after you arrive home to insure a convenient appointment time. °10. OTHER INSTRUCTIONS: __________________________________________________________________________________________________________________________ __________________________________________________________________________________________________________________________ °WHEN TO CALL YOUR DOCTOR: °1. Fever over 101.0 °2. Inability to urinate °3. Continued bleeding from incision. °4. Increased pain, redness, or drainage from the incision. °5. Increasing abdominal pain ° °The clinic staff is available to answer your questions during regular business hours.  Please don’t hesitate to call and ask to speak to one of the nurses for clinical concerns.  If you have a medical emergency, go to the nearest emergency room or call 911.  A surgeon from [facility] is always on call at the hospital. °[address]   [address], [place], [place], [place]  [address] ? [address], [place], [place]   [address] [phone] ? [phone] ? FAX [phone] Web site: [email or web address]   Atrial Fibrillation Atrial fibrillation is a type of irregular or rapid heartbeat (arrhythmia). In atrial fibrillation, the heart quivers continuously in a chaotic pattern. This occurs when parts of the heart receive disorganized signals that make the heart unable to pump blood normally. This can increase the risk for stroke, heart failure, and other  heart-related conditions. There are different types of atrial fibrillation, including:  Paroxysmal atrial fibrillation. This type starts suddenly, and it usually stops on its own shortly after it starts.  Persistent atrial fibrillation. This type often lasts longer than a week. It may stop on its own or with treatment.  Long-lasting persistent atrial fibrillation. This type lasts longer than 12 months.  Permanent atrial fibrillation. This type does not go away.  Talk with your health care provider to learn about the type of atrial fibrillation that you have. What are the causes? This condition is caused by some heart-related conditions or procedures, including:  A heart attack.  Coronary artery disease.  Heart failure.  Heart valve conditions.  High blood pressure.  Inflammation of the sac that surrounds the heart (pericarditis).  Heart surgery.  Certain heart rhythm disorders, such as Wolf-Parkinson-White syndrome.  Other causes include:  Pneumonia.  Obstructive sleep apnea.  Blockage of an artery in the lungs (pulmonary embolism, or PE).  Lung cancer.  Chronic lung disease.  Thyroid problems, especially if the thyroid is overactive (hyperthyroidism).  Caffeine.  Excessive alcohol use or illegal drug use.  Use of some medicines, including certain decongestants and diet pills.  Sometimes, the cause cannot be found. What increases the risk? This condition is more likely to develop in:  People who are older in age.  People who smoke.  People who have diabetes mellitus.  People who are overweight (obese).  Athletes who exercise vigorously.  What are the signs or symptoms? Symptoms of this condition include:  A feeling that your heart is beating rapidly or irregularly.  A feeling of discomfort or pain in your chest.  Shortness of breath.  Sudden light-headedness or weakness.  Getting tired easily during exercise.  In some cases, there are no  symptoms. How is this diagnosed? Your health care provider may be able to detect atrial fibrillation when taking your pulse. If detected, this condition may be diagnosed with:  An electrocardiogram (ECG).  A Holter monitor test that records your heartbeat patterns over a 24-hour period.  Transthoracic echocardiogram (TTE) to evaluate how blood flows through your heart.  Transesophageal echocardiogram (TEE) to view more detailed images of your heart.  A stress test.  Imaging tests, such as a CT scan or chest X-ray.  Blood tests.  How is this treated? The main goals of treatment are to prevent blood clots from forming and to keep your heart beating at a normal rate and rhythm. The type of treatment that you receive depends on many factors, such as your underlying medical conditions and how you feel when you are experiencing atrial fibrillation. This condition may be treated with:  Medicine to slow down the heart rate, bring the hearts rhythm back to normal, or prevent clots from forming.  Electrical cardioversion. This is a procedure that resets your hearts rhythm by delivering a controlled, low-energy shock to the heart through your skin.  Different types of ablation, such as catheter ablation, catheter ablation with pacemaker,  or surgical ablation. These procedures destroy the heart tissues that send abnormal signals. When the pacemaker is used, it is placed under your skin to help your heart beat in a regular rhythm.  Follow these instructions at home:  Take over-the counter and prescription medicines only as told by your health care provider.  If your health care provider prescribed a blood-thinning medicine (anticoagulant), take it exactly as told. Taking too much blood-thinning medicine can cause bleeding. If you do not take enough blood-thinning medicine, you will not have the protection that you need against stroke and other problems.  Do not use tobacco products, including  cigarettes, chewing tobacco, and e-cigarettes. If you need help quitting, ask your health care provider.  If you have obstructive sleep apnea, manage your condition as told by your health care provider.  Do not drink alcohol.  Do not drink beverages that contain caffeine, such as coffee, soda, and tea.  Maintain a healthy weight. Do not use diet pills unless your health care provider approves. Diet pills may make heart problems worse.  Follow diet instructions as told by your health care provider.  Exercise regularly as told by your health care provider.  Keep all follow-up visits as told by your health care provider. This is important. How is this prevented?  Avoid drinking beverages that contain caffeine or alcohol.  Avoid certain medicines, especially medicines that are used for breathing problems.  Avoid certain herbs and herbal medicines, such as those that contain ephedra or ginseng.  Do not use illegal drugs, such as cocaine and amphetamines.  Do not smoke.  Manage your high blood pressure. Contact a health care provider if:  You notice a change in the rate, rhythm, or strength of your heartbeat.  You are taking an anticoagulant and you notice increased bruising.  You tire more easily when you exercise or exert yourself. Get help right away if:  You have chest pain, abdominal pain, sweating, or weakness.  You feel nauseous.  You notice blood in your vomit, bowel movement, or urine.  You have shortness of breath.  You suddenly have swollen feet and ankles.  You feel dizzy.  You have sudden weakness or numbness of the face, arm, or leg, especially on one side of the body.  You have trouble speaking, trouble understanding, or both (aphasia).  Your face or your eyelid droops on one side. These symptoms may represent a serious problem that is an emergency. Do not wait to see if the symptoms will go away. Get medical help right away. Call your local emergency  services (911 in the [place] Do not drive yourself to the hospital. This information is not intended to replace advice given to you by your health care provider. Make sure you discuss any questions you have with your health care provider. Document Released: 09/02/2005 Document Revised: 01/10/2016 Document Reviewed: 12/28/2014 Elsevier Interactive Patient Education  2018 [employer].   Apixaban oral tablets What is this medicine? APIXABAN (a PIX a ban) is an anticoagulant (blood thinner). It is used to lower the chance of stroke in people with a medical condition called atrial fibrillation. It is also used to treat or prevent blood clots in the lungs or in the veins. This medicine may be used for other purposes; ask your health care provider or pharmacist if you have questions. COMMON BRAND NAME(S): Eliquis What should I tell my health care provider before I take this medicine? They need to know if you have any of these conditions: -  bleeding disorders -bleeding in the brain -blood in your stools (black or tarry stools) or if you have blood in your vomit -history of stomach bleeding -kidney disease -liver disease -mechanical heart valve -an unusual or allergic reaction to apixaban, other medicines, foods, dyes, or preservatives -pregnant or trying to get pregnant -breast-feeding How should I use this medicine? Take this medicine by mouth with a glass of water. Follow the directions on the prescription label. You can take it with or without food. If it upsets your stomach, take it with food. Take your medicine at regular intervals. Do not take it more often than directed. Do not stop taking except on your doctor's advice. Stopping this medicine may increase your risk of a blot clot. Be sure to refill your prescription before you run out of medicine. Talk to your pediatrician regarding the use of this medicine in children. Special care may be needed. Overdosage: If you think you have taken too  much of this medicine contact a poison control center or emergency room at once. NOTE: This medicine is only for you. Do not share this medicine with others. What if I miss a dose? If you miss a dose, take it as soon as you can. If it is almost time for your next dose, take only that dose. Do not take double or extra doses. What may interact with this medicine? This medicine may interact with the following: -aspirin and aspirin-like medicines -certain medicines for fungal infections like ketoconazole and itraconazole -certain medicines for seizures like carbamazepine and phenytoin -certain medicines that treat or prevent blood clots like warfarin, enoxaparin, and dalteparin -clarithromycin -NSAIDs, medicines for pain and inflammation, like ibuprofen or naproxen -rifampin -ritonavir -St. John's wort This list may not describe all possible interactions. Give your health care provider a list of all the medicines, herbs, non-prescription drugs, or dietary supplements you use. Also tell them if you smoke, drink alcohol, or use illegal drugs. Some items may interact with your medicine. What should I watch for while using this medicine? Visit your doctor or health care professional for regular checks on your progress. Notify your doctor or health care professional and seek emergency treatment if you develop breathing problems; changes in vision; chest pain; severe, sudden headache; pain, swelling, warmth in the leg; trouble speaking; sudden numbness or weakness of the face, arm or leg. These can be signs that your condition has gotten worse. If you are going to have surgery or other procedure, tell your doctor that you are taking this medicine. What side effects may I notice from receiving this medicine? Side effects that you should report to your doctor or health care professional as soon as possible: -allergic reactions like skin rash, itching or hives, swelling of the face, lips, or tongue -signs  and symptoms of bleeding such as bloody or black, tarry stools; red or dark-brown urine; spitting up blood or brown material that looks like coffee grounds; red spots on the skin; unusual bruising or bleeding from the eye, gums, or nose This list may not describe all possible side effects. Call your doctor for medical advice about side effects. You may report side effects to FDA at 1-800-FDA-1088. Where should I keep my medicine? Keep out of the reach of children. Store at room temperature between 20 and 25 degrees C (68 and 77 degrees F). Throw away any unused medicine after the expiration date. NOTE: This sheet is a summary. It may not cover all possible information. If you have questions about  this medicine, talk to your doctor, pharmacist, or health care provider.  2018 Elsevier/Gold Standard (2016-03-25 11:54:23)

## 2017-09-21 NOTE — Progress Notes (Signed)
  Echocardiogram 2D Echocardiogram has been performed.  James Owen F 09/21/2017, 10:29 AM

## 2017-09-21 NOTE — Anesthesia Postprocedure Evaluation (Signed)
Anesthesia Post Note  Patient: James Owen  Procedure(s) Performed: LAPAROSCOPIC CHOLECYSTECTOMY (N/A Abdomen)     Patient location during evaluation: PACU Anesthesia Type: General Level of consciousness: awake and alert Pain management: pain level controlled Vital Signs Assessment: post-procedure vital signs reviewed and stable Respiratory status: spontaneous breathing, nonlabored ventilation, respiratory function stable and patient connected to nasal cannula oxygen Cardiovascular status: blood pressure returned to baseline and stable Postop Assessment: no apparent nausea or vomiting Anesthetic complications: no    Last Vitals:  Vitals:   09/21/17 0000 09/21/17 0400  BP:    Pulse:    Resp:    Temp: 36.8 C 36.6 C  SpO2:      Last Pain:  Vitals:   09/21/17 0400  TempSrc: Oral  PainSc:                  [place]

## 2017-09-22 ENCOUNTER — Other Ambulatory Visit: Payer: Self-pay | Admitting: Internal Medicine

## 2017-09-22 ENCOUNTER — Encounter (HOSPITAL_COMMUNITY): Payer: Self-pay | Admitting: Surgery

## 2017-09-22 DIAGNOSIS — R1011 Right upper quadrant pain: Secondary | ICD-10-CM

## 2017-09-22 NOTE — Discharge Summary (Signed)
Physician Discharge Summary  Patient ID: James Owen MRN: [id number] DOB/AGE: 01/20/1946 72 y.o.  Admit date: 09/18/2017 Discharge date: 09/22/2017  Admission Diagnoses:  Acute cholecystitis possible choledocholithiasis Elevated bilirubin Mild renal insufficiency Hypothyroid -on Synthroid replacement  Discharge Diagnoses:  Acute cholecystitis possible choledocholithiasis Elevated bilirubin Mild renal insufficiency Hypothyroid -on Synthroid replacement Atrial fibrillation with RVR   Active Problems:   Choledocholithiasis   Cholecystitis   RUQ pain   Elevated bilirubin   S/P laparoscopic cholecystectomy   PROCEDURES: Laparoscopic cholecystectomy 09/19/17, Dr. Tana Conch Course:  James Owen is an 72 y.o. male who is here for RUQ pain which started for the first time on 09/15/17. The pain got a bit better of the next day but returned yesterday. He was seen by his PCP who ordered a CT scan for later today but advised him to come to ED if nausea/vomiting. Subsequently had emesis and presented to ED for evaluation. Pain is sharp, RUQ, nonradiating. No prior episodes of this before this week. Has also had associated episodes of diarrhea. +Chills at home; tmax 100F.   CT scan showed extensive inflammatory changes about the gallbladder concerning for acute cholecystitis and scattered internal hyperdensity suspicious for cholelithiasis.  There is mild intra-and extrahepatic biliary dilatation with a possible obstructive stone in the distal common bile duct.  Ultrasound showed acute cholecystitis and cholelithiasis.  No common duct stones were visualized on the ultrasound.  After admission patient was seen in consultation by Dr. Henrene Pastor,  GI service.  It was Dr. Blanch Media opinion patient had no evidence of cholangitis and it was not clear he had choledocholithiasis.  He recommended surgery with IOC for later that day.  He was seen by Dr. Dema Severin and taken to the  operating room later that day.  He was found to have an acute gangrenous cholecystitis.  Critical view was established.  Due to the tenuous nature of the tissue in the triangle of Lorin Glass a potentially short cystic duct and a cholangiogram was not obtained.  A piece of Surgicel was laid on the cystic plate at the end of the procedure.  Patient was found to be in atrial fibrillation on arrival to preop.  This was present on the admission EKG.  His rate was controlled by anesthesia and I went forward with the procedure.  He was seen postop by cardiology/Dr. Adam Phenix.  Patient's onset of atrial fibrillation was unknown.  He was not aware of it prior to admission.  It was their recommendation he undergo anticoagulation as soon is safe from a surgical standpoint.  Postoperatively he did well from his surgery.  His bilirubin returned to normal 1.1 from a high of 2.224 hours prior.  His other LFTs were normal.  Remained at 1.51 which was his admission creatinine.  He was seen by Dr. Carol Ada on 09/20/17.  It was noted his total bilirubin had normalized.  The original plan to pursue an MRCP was adjusted with plans to follow his liver enzymes, make a decision based on this.  He was seen by Dr. Caryl Comes again on 09/21/17.  He would like to initiate anticoagulation, and a message was sent for follow-up.  He was seen again by Dr. Barry Dienes on 09/21/17 and discharged home.  Started on anticoagulation per Dr. Olin Pia recommendation.  He was scheduled to follow-up in the [facility], and to follow-up with Dr. Klein/cardiology service.  He also needs to follow-up with his primary care physician.  Condition on discharge: Improved.  Pathology:Gallbladder - ACUTE AND CHRONIC CHOLECYSTITIS AND CHOLELITHIASIS WITH NECROSIS. - THERE IS NO EVIDENCE OF MALIGNANCY.   Echocardiogram on 09/21/17: Left ventricular ejection fraction 60-65%.  Normal cavity size.  With moderate LVH.  Aortic valve was normal.  Aortic root was normal.  Mitral  valve showed mild annular calcification.  Mobility was not restricted.  Mitral valve was calcified mobility was not restricted.  No evidence for stenosis or regurgitation.  RV was normal.   CBC Latest Ref Rng & Units 09/20/2017 09/19/2017 09/18/2017  WBC 4.0 - 10.5 K/uL 9.1 13.6(H) 13.9(H)  Hemoglobin 13.0 - 17.0 g/dL 14.9 14.8 15.7  Hematocrit 39.0 - 52.0 % 44.6 43.9 46.6  Platelets 150 - 400 K/uL 170 159 174   CMP Latest Ref Rng & Units 09/20/2017 09/19/2017 09/18/2017  Glucose 65 - 99 mg/dL 145(H) 120(H) 135(H)  BUN 6 - 20 mg/dL 30(H) 26(H) 25(H)  Creatinine 0.61 - 1.24 mg/dL 1.51(H) 1.53(H) 1.51(H)  Sodium 135 - 145 mmol/L 139 135 133(L)  Potassium 3.5 - 5.1 mmol/L 4.2 4.1 4.0  Chloride 101 - 111 mmol/L 107 107 103  CO2 22 - 32 mmol/L 25 22 22   Calcium 8.9 - 10.3 mg/dL 8.2(L) 8.5(L) 8.8(L)  Total Protein 6.5 - 8.1 g/dL 6.5 6.3(L) 6.9  Total Bilirubin 0.3 - 1.2 mg/dL 1.1 2.2(H) 1.8(H)  Alkaline Phos 38 - 126 U/L 57 55 62  AST 15 - 41 U/L 37 22 23  ALT 17 - 63 U/L 24 17 17    Disposition: 01-Home or Self Care  Discharge Instructions    Call MD for:  difficulty breathing, headache or visual disturbances   Complete by:  As directed    Call MD for:  persistant dizziness or light-headedness   Complete by:  As directed    Call MD for:  persistant nausea and vomiting   Complete by:  As directed    Call MD for:  redness, tenderness, or signs of infection (pain, swelling, redness, odor or green/yellow discharge around incision site)   Complete by:  As directed    Call MD for:  severe uncontrolled pain   Complete by:  As directed    Call MD for:  temperature >100.4   Complete by:  As directed    Diet - low sodium heart healthy   Complete by:  As directed    Increase activity slowly   Complete by:  As directed      Allergies as of 09/21/2017   No Known Allergies     Medication List    TAKE these medications   apixaban 5 MG Tabs tablet Commonly known as:  ELIQUIS Take 1 tablet (5 mg  total) by mouth 2 (two) times daily.   levothyroxine 50 MCG tablet Commonly known as:  SYNTHROID, LEVOTHROID Take 50 mcg by mouth daily before breakfast.   metoprolol tartrate 50 MG tablet Commonly known as:  LOPRESSOR Take 1 tablet (50 mg total) by mouth 2 (two) times daily.      Follow-up Information    [facility], [place] Follow up on 09/30/2017.   Specialty:  General Surgery Why:  Your appointment is at 10:15 AM, be at the office 30 minutes early for check in.  Bring photo ID and insurance information.   Contact information: [place] [address] [phone]        [facility] Follow up in 2 week(s).   Specialty:  Cardiology Why:  Dr. Caryl Comes has sent info  for them to make appt.  Call if nothing set up by wednesday. Contact information: [address] [place]          Signed: Earnstine Regal 09/22/2017, 2:12 PM

## 2017-09-25 ENCOUNTER — Telehealth: Payer: Self-pay | Admitting: Internal Medicine

## 2017-09-25 NOTE — Telephone Encounter (Signed)
The patient is aware of his echo results.  He is scheduled to follow up with Roderic Palau, NP in the a-fib clinic on 10/07/17. I advised him to keep this appointment and at that time, if he is still out of rhythm, then we may need to proceed with DCCV. He states that his HR's have been down in the 60's but he cannot tell if he is regular or not. I have advised him to continue metoprolol as he is taking this, but to call the office if he starts to feel dizzy, lightheaded, pre-syncopal, or has syncope. He voices understanding.

## 2017-10-07 ENCOUNTER — Encounter (HOSPITAL_COMMUNITY): Payer: Self-pay | Admitting: Nurse Practitioner

## 2017-10-07 ENCOUNTER — Ambulatory Visit (HOSPITAL_COMMUNITY)
Admission: RE | Admit: 2017-10-07 | Discharge: 2017-10-07 | Disposition: A | Discharge status: Home / Self Care | Payer: PPO | Source: Ambulatory Visit | Attending: Nurse Practitioner | Admitting: Nurse Practitioner

## 2017-10-07 VITALS — BP 118/72 | HR 68 | Ht 72.0 in | Wt 199.0 lb

## 2017-10-07 DIAGNOSIS — Z808 Family history of malignant neoplasm of other organs or systems: Secondary | ICD-10-CM | POA: Insufficient documentation

## 2017-10-07 DIAGNOSIS — Z7902 Long term (current) use of antithrombotics/antiplatelets: Secondary | ICD-10-CM | POA: Diagnosis not present

## 2017-10-07 DIAGNOSIS — Z885 Allergy status to narcotic agent status: Secondary | ICD-10-CM | POA: Insufficient documentation

## 2017-10-07 DIAGNOSIS — I480 Paroxysmal atrial fibrillation: Secondary | ICD-10-CM

## 2017-10-07 DIAGNOSIS — Z79899 Other long term (current) drug therapy: Secondary | ICD-10-CM | POA: Diagnosis not present

## 2017-10-07 DIAGNOSIS — E079 Disorder of thyroid, unspecified: Secondary | ICD-10-CM | POA: Insufficient documentation

## 2017-10-07 DIAGNOSIS — Z803 Family history of malignant neoplasm of breast: Secondary | ICD-10-CM | POA: Diagnosis not present

## 2017-10-07 DIAGNOSIS — Z9049 Acquired absence of other specified parts of digestive tract: Secondary | ICD-10-CM | POA: Diagnosis not present

## 2017-10-07 DIAGNOSIS — Z9889 Other specified postprocedural states: Secondary | ICD-10-CM | POA: Insufficient documentation

## 2017-10-07 MED ORDER — METOPROLOL SUCCINATE ER 25 MG PO TB24: 25.0000 mg | ORAL_TABLET | Freq: Every day | ORAL | 2 refills | Status: DC

## 2017-10-07 NOTE — Patient Instructions (Signed)
Change to metoprolol succinate 25mg  once a day at bedtime.  Scheduler will be in touch to schedule appointment with Dr. Caryl Comes.

## 2017-10-07 NOTE — Progress Notes (Signed)
Primary Care Physician: James Pao, MD Referring Physician: Dr. Dimas Aguas Kervin Owen is a 72 y.o. male with a h/o  acute gangrenous cholecystitis that was found to have new diagnosis of afib upon hospitalization 09/18/17. Date of onset unknown. He was rate controlled and his surgery went on as planned. Postoperatively he did well. Dr. Caryl Owen recommended anticoagulation when stable post op. He was discharged on eliquis and metoprolol. F/u echo showed dilated L/R atrium and normal EF.  He is in the afib clinic for f/u, 1/22. He is back in SR. He continues tot recover nicely but he is not happy to be on medications and would like to stop both metoprolol and the Eliquis. No bleeding issues but states that he feels cold easier now and feels sluggish on the metoprolol. CBC 2 weeks ago normal. No bleeding issues. He does not drink alcohol, minimal caffeine, no tobacco, no snoring /apnea.  Today, he denies symptoms of palpitations, chest pain, shortness of breath, orthopnea, PND, lower extremity edema, dizziness, presyncope, syncope, or neurologic sequela.   Past Medical History:  Diagnosis Date  . Thyroid disease    Past Surgical History:  Procedure Laterality Date  . CHOLECYSTECTOMY N/A 09/19/2017   Procedure: LAPAROSCOPIC CHOLECYSTECTOMY;  Surgeon: James Roup, MD;  Location: [facility];  Service: General;  Laterality: N/A;  . MULTIPLE TOOTH EXTRACTIONS     has all dentures    Current Outpatient Medications  Medication Sig Dispense Refill  . apixaban (ELIQUIS) 5 MG TABS tablet Take 1 tablet (5 mg total) by mouth 2 (two) times daily. 60 tablet 0  . levothyroxine (SYNTHROID, LEVOTHROID) 50 MCG tablet Take 50 mcg by mouth daily before breakfast.    . metoprolol succinate (TOPROL XL) 25 MG 24 hr tablet Take 1 tablet (25 mg total) by mouth at bedtime. 30 tablet 2   No current facility-administered medications for this encounter.     Allergies  Allergen Reactions  .  Vicodin [Hydrocodone-Acetaminophen]     hallucinations    Social History   Socioeconomic History  . Marital status: Married    Spouse name: Not on file  . Number of children: Not on file  . Years of education: Not on file  . Highest education level: Not on file  Social Needs  . Financial resource strain: Not on file  . Food insecurity - worry: Not on file  . Food insecurity - inability: Not on file  . Transportation needs - medical: Not on file  . Transportation needs - non-medical: Not on file  Occupational History  . Not on file  Tobacco Use  . Smoking status: Never Smoker  . Smokeless tobacco: Current User    Types: Chew  . Tobacco comment: occasionally  Substance and Sexual Activity  . Alcohol use: Yes    Comment: SELDOM  . Drug use: No  . Sexual activity: Not on file  Other Topics Concern  . Not on file  Social History Narrative  . Not on file    Family History  Problem Relation Age of Onset  . Melanoma Sister   . Breast cancer Sister     ROS- All systems are reviewed and negative except as per the HPI above  Physical Exam: Vitals:   10/07/17 1324  BP: 118/72  Pulse: 68  Weight: 199 lb (90.3 kg)  Height: 6' (1.829 m)   Wt Readings from Last 3 Encounters:  10/07/17 199 lb (90.3 kg)  09/19/17 201 lb (91.2 kg)  06/22/13 204 lb (92.5 kg)    Labs: Lab Results  Component Value Date   NA 139 09/20/2017   K 4.2 09/20/2017   CL 107 09/20/2017   CO2 25 09/20/2017   GLUCOSE 145 (H) 09/20/2017   BUN 30 (H) 09/20/2017   CREATININE 1.51 (H) 09/20/2017   CALCIUM 8.2 (L) 09/20/2017   PHOS 2.1 (L) 09/19/2017   MG 2.0 09/19/2017   No results found for: INR No results found for: CHOL, HDL, LDLCALC, TRIG   GEN- The patient is well appearing, alert and oriented x 3 today.   Head- normocephalic, atraumatic Eyes-  Sclera clear, conjunctiva pink Ears- hearing intact Oropharynx- clear Neck- supple, no JVP Lymph- no cervical lymphadenopathy Lungs- Clear  to ausculation bilaterally, normal work of breathing Heart- Regular rate and rhythm, no murmurs, rubs or gallops, PMI not laterally displaced GI- soft, NT, ND, + BS Extremities- no clubbing, cyanosis, or edema MS- no significant deformity or atrophy Skin- no rash or lesion Psych- euthymic mood, full affect Neuro- strength and sensation are intact  EKG- NSR at 68 bpm, normal ekg Echo-Study Conclusions  - Left ventricle: The cavity size was normal. Wall thickness was   increased in a pattern of moderate LVH. Systolic function was   normal. The estimated ejection fraction was in the range of 60%   to 65%. Although no diagnostic regional wall motion abnormality   was identified, this possibility cannot be completely excluded on   the basis of this study. - Mitral valve: Mildly calcified annulus. - Left atrium: The atrium was moderately to severely dilated. - Right atrium: The atrium was moderately to severely dilated. - Pericardium, extracardiac: A trivial pericardial effusion was   identified.   Assessment and Plan: 1. Paroxysmal  afib General education re afib Triggers reviewed New dx at time of surgery but onset unknown Now in SR Feels sluggish on metoprolol 50 mg bid and will decrease dose to 1/2 tab bid, any sensation of palpitations, go back to usual dose  2. Chadsvasc score of 1(age)  Really would like to get off anticoagulation but I feel he needs to be on at least one month Bleeding precautions discussed   I  will arrange f/u with Dr. Caryl Owen to further discuss stopping/ continuing above drugs  James Owen, [facility] [address] [place], [place] [address] [phone]

## 2017-10-15 ENCOUNTER — Telehealth (HOSPITAL_COMMUNITY): Payer: Self-pay | Admitting: *Deleted

## 2017-10-15 MED ORDER — DILTIAZEM HCL ER COATED BEADS 120 MG PO CP24
120.0000 mg | ORAL_CAPSULE | Freq: Every day | ORAL | 4 refills | Status: DC
Start: 2017-10-15 — End: 2017-11-11

## 2017-10-15 MED ORDER — APIXABAN 5 MG PO TABS: 5.0000 mg | ORAL_TABLET | Freq: Two times a day (BID) | ORAL | 3 refills | Status: DC

## 2017-10-15 NOTE — Telephone Encounter (Signed)
Pt called in stating since being on metoprolol he has had a chronic runny nose and cough. Questioned whether patient could have allergies or URI - pt denies this as possible cause. Discussed with Roderic Palau NP will stop metoprolol and start cardizem cd 120mg  once a day. Pt is agreement with plan.

## 2017-11-11 ENCOUNTER — Ambulatory Visit (INDEPENDENT_AMBULATORY_CARE_PROVIDER_SITE_OTHER): Payer: PPO | Admitting: Internal Medicine

## 2017-11-11 ENCOUNTER — Encounter: Payer: Self-pay | Admitting: Internal Medicine

## 2017-11-11 VITALS — BP 132/76 | HR 68 | Ht 72.0 in | Wt 197.2 lb

## 2017-11-11 DIAGNOSIS — I480 Paroxysmal atrial fibrillation: Secondary | ICD-10-CM

## 2017-11-11 NOTE — Patient Instructions (Addendum)
Medication Instructions:  Your physician has recommended you make the following change in your medication:   1. Stop Cardizem 2. Stop Eliquis the night before your Linq recorder implant  Labwork:  BMP and CBC  Testing/Procedures: None ordered.  Follow-Up: Your physician recommends that you schedule a follow-up appointment in:   10-14 Days with the Device Clinic 1 year with Dr Caryl Comes   Any Other Special Instructions Will Be Listed Below (If Applicable).  Please arrive at the [facility] main entrance of [facility] hospital at:   Wed March 6 at 7:30am Discontinue your Eliquis the evening before your procedure.     If you need a refill on your cardiac medications before your next appointment, please call your pharmacy.

## 2017-11-11 NOTE — Progress Notes (Signed)
      Patient Care Team: Tisovec, Fransico Him, MD as PCP - General (Internal Medicine) Deboraha Sprang, MD as PCP - Cardiology (Cardiology)   HPI  James Owen is a 72 y.o. male Seen in follow-up for atrial fibrillation identified January 2019 when he presented cholecystitis.  He underwent lap chole for gangrenous gallbladder.  Anticoagulation was subsequently initiated.  He has no antedcedent history of hypertension;  Notably he was unaware of palpitations at the time of discovery of afib  He would like to stop his meds  Records and Results Reviewed   Past Medical History:  Diagnosis Date  . Thyroid disease     Past Surgical History:  Procedure Laterality Date  . CHOLECYSTECTOMY N/A 09/19/2017   Procedure: LAPAROSCOPIC CHOLECYSTECTOMY;  Surgeon: Ileana Roup, MD;  Location: [facility];  Service: General;  Laterality: N/A;  . MULTIPLE TOOTH EXTRACTIONS     has all dentures    Current Outpatient Medications  Medication Sig Dispense Refill  . apixaban (ELIQUIS) 5 MG TABS tablet Take 1 tablet (5 mg total) by mouth 2 (two) times daily. 60 tablet 3  . levothyroxine (SYNTHROID, LEVOTHROID) 50 MCG tablet Take 50 mcg by mouth daily before breakfast.     No current facility-administered medications for this visit.     Allergies  Allergen Reactions  . Vicodin [Hydrocodone-Acetaminophen]     hallucinations      Review of Systems negative except from HPI and PMH  Physical Exam BP 132/76   Pulse 68   Ht 6' (1.829 m)   Wt 197 lb 3.2 oz (89.4 kg)   BMI 26.75 kg/m  Well developed and nourished in no acute distress HENT normal Neck supple with JVP-flat Clear Regular rate and rhythm, no murmurs or gallops Abd-soft with active BS No Clubbing cyanosis edema Skin-warm and dry A & Oriented  Grossly normal sensory and motor function   ECG  Sinus @ 68 15/08/39  Assessment and  Plan  Atrial fibrillation-persistent  Acute cholecystitis status post lap  chole  Renal insufficiency grade 3    We had a lengthy discussion regarding his medications.  He would like to not take any of them.  He has no antecedent history of hypertension.  We will stop his Cardizem.  He has a blood pressure machine at home and will check his blood pressure periodically.  In the absence of symptoms, it is hard to know whether he has atrial fibrillation or whether the event in the context of his cholecystitis was acute and limited.  There are recently published data outlining the diminished risks associated with atrial fibrillation and surgical presentations.  Discussions with him and his family we reviewed the options of 1-stopping the anticoagulation altogether, 2-continuing the anticoagulation, 3 discontinuation of anticoagulation with intermittent monitoring using either a-pulse oximetry P-implantable recorder.  For reassurance of more thorough monitoring, they have elected to proceed with an implantable loop recorder  More than 50% of 45 min was spent in counseling related to the above       Current medicines are reviewed at length with the patient today .  The patient does not  have concerns regarding medicines.

## 2017-11-11 NOTE — H&P (View-Only) (Signed)
      Patient Care Team: Tisovec, Fransico Him, MD as PCP - General (Internal Medicine) Deboraha Sprang, MD as PCP - Cardiology (Cardiology)   HPI  James Owen is a 72 y.o. male Seen in follow-up for atrial fibrillation identified January 2019 when he presented cholecystitis.  He underwent lap chole for gangrenous gallbladder.  Anticoagulation was subsequently initiated.  He has no antedcedent history of hypertension;  Notably he was unaware of palpitations at the time of discovery of afib  He would like to stop his meds  Records and Results Reviewed   Past Medical History:  Diagnosis Date  . Thyroid disease     Past Surgical History:  Procedure Laterality Date  . CHOLECYSTECTOMY N/A 09/19/2017   Procedure: LAPAROSCOPIC CHOLECYSTECTOMY;  Surgeon: Ileana Roup, MD;  Location: [facility];  Service: General;  Laterality: N/A;  . MULTIPLE TOOTH EXTRACTIONS     has all dentures    Current Outpatient Medications  Medication Sig Dispense Refill  . apixaban (ELIQUIS) 5 MG TABS tablet Take 1 tablet (5 mg total) by mouth 2 (two) times daily. 60 tablet 3  . levothyroxine (SYNTHROID, LEVOTHROID) 50 MCG tablet Take 50 mcg by mouth daily before breakfast.     No current facility-administered medications for this visit.     Allergies  Allergen Reactions  . Vicodin [Hydrocodone-Acetaminophen]     hallucinations      Review of Systems negative except from HPI and PMH  Physical Exam BP 132/76   Pulse 68   Ht 6' (1.829 m)   Wt 197 lb 3.2 oz (89.4 kg)   BMI 26.75 kg/m  Well developed and nourished in no acute distress HENT normal Neck supple with JVP-flat Clear Regular rate and rhythm, no murmurs or gallops Abd-soft with active BS No Clubbing cyanosis edema Skin-warm and dry A & Oriented  Grossly normal sensory and motor function   ECG  Sinus @ 68 15/08/39  Assessment and  Plan  Atrial fibrillation-persistent  Acute cholecystitis status post lap  chole  Renal insufficiency grade 3    We had a lengthy discussion regarding his medications.  He would like to not take any of them.  He has no antecedent history of hypertension.  We will stop his Cardizem.  He has a blood pressure machine at home and will check his blood pressure periodically.  In the absence of symptoms, it is hard to know whether he has atrial fibrillation or whether the event in the context of his cholecystitis was acute and limited.  There are recently published data outlining the diminished risks associated with atrial fibrillation and surgical presentations.  Discussions with him and his family we reviewed the options of 1-stopping the anticoagulation altogether, 2-continuing the anticoagulation, 3 discontinuation of anticoagulation with intermittent monitoring using either a-pulse oximetry P-implantable recorder.  For reassurance of more thorough monitoring, they have elected to proceed with an implantable loop recorder  More than 50% of 45 min was spent in counseling related to the above       Current medicines are reviewed at length with the patient today .  The patient does not  have concerns regarding medicines.

## 2017-11-12 LAB — BASIC METABOLIC PANEL
BUN/Creatinine Ratio: 11 (ref 10–24)
BUN: 12 mg/dL (ref 8–27)
CALCIUM: 9.1 mg/dL (ref 8.6–10.2)
CO2: 24 mmol/L (ref 20–29)
CREATININE: 1.14 mg/dL (ref 0.76–1.27)
Chloride: 103 mmol/L (ref 96–106)
GFR calc Af Amer: 73 mL/min/{1.73_m2} (ref >59)
GFR, EST NON AFRICAN AMERICAN: 63 mL/min/{1.73_m2} (ref >59)
GLUCOSE: 89 mg/dL (ref 65–99)
Potassium: 5.1 mmol/L (ref 3.5–5.2)
Sodium: 142 mmol/L (ref 134–144)

## 2017-11-12 LAB — CBC WITH DIFFERENTIAL/PLATELET
BASOS ABS: 0 10*3/uL (ref 0.0–0.2)
Basos: 0 %
EOS (ABSOLUTE): 0.2 10*3/uL (ref 0.0–0.4)
EOS: 3 %
HEMATOCRIT: 42.4 % (ref 37.5–51.0)
HEMOGLOBIN: 14.1 g/dL (ref 13.0–17.7)
IMMATURE GRANS (ABS): 0 10*3/uL (ref 0.0–0.1)
Immature Granulocytes: 0 %
LYMPHS: 20 %
Lymphocytes Absolute: 1.4 10*3/uL (ref 0.7–3.1)
MCH: 29.7 pg (ref 26.6–33.0)
MCHC: 33.3 g/dL (ref 31.5–35.7)
MCV: 90 fL (ref 79–97)
MONOCYTES: 10 %
Monocytes Absolute: 0.7 10*3/uL (ref 0.1–0.9)
Neutrophils Absolute: 4.7 10*3/uL (ref 1.4–7.0)
Neutrophils: 67 %
Platelets: 255 10*3/uL (ref 150–379)
RBC: 4.74 x10E6/uL (ref 4.14–5.80)
RDW: 13.7 % (ref 12.3–15.4)
WBC: 7 10*3/uL (ref 3.4–10.8)

## 2017-11-19 ENCOUNTER — Encounter (HOSPITAL_COMMUNITY)
Admission: RE | Disposition: A | Discharge status: Home / Self Care | Payer: Self-pay | Source: Ambulatory Visit | Attending: Internal Medicine

## 2017-11-19 ENCOUNTER — Encounter (HOSPITAL_COMMUNITY): Payer: Self-pay | Admitting: Internal Medicine

## 2017-11-19 ENCOUNTER — Ambulatory Visit (HOSPITAL_COMMUNITY)
Admission: RE | Admit: 2017-11-19 | Discharge: 2017-11-19 | Disposition: A | Discharge status: Home / Self Care | Payer: PPO | Source: Ambulatory Visit | Attending: Internal Medicine | Admitting: Internal Medicine

## 2017-11-19 DIAGNOSIS — E079 Disorder of thyroid, unspecified: Secondary | ICD-10-CM | POA: Insufficient documentation

## 2017-11-19 DIAGNOSIS — Z7901 Long term (current) use of anticoagulants: Secondary | ICD-10-CM | POA: Insufficient documentation

## 2017-11-19 DIAGNOSIS — N289 Disorder of kidney and ureter, unspecified: Secondary | ICD-10-CM | POA: Diagnosis not present

## 2017-11-19 DIAGNOSIS — I481 Persistent atrial fibrillation: Secondary | ICD-10-CM | POA: Diagnosis not present

## 2017-11-19 DIAGNOSIS — I4891 Unspecified atrial fibrillation: Secondary | ICD-10-CM

## 2017-11-19 HISTORY — PX: LOOP RECORDER INSERTION: EP1214

## 2017-11-19 SURGERY — LOOP RECORDER INSERTION

## 2017-11-19 MED ORDER — LIDOCAINE-EPINEPHRINE 1 %-1:100000 IJ SOLN
INTRAMUSCULAR | Status: AC
  Filled 2017-11-19: qty 1

## 2017-11-19 SURGICAL SUPPLY — 2 items
LOOP REVEAL LINQSYS (Prosthesis & Implant Heart) ×2 IMPLANT
PACK LOOP INSERTION (CUSTOM PROCEDURE TRAY) ×3 IMPLANT

## 2017-11-19 NOTE — Discharge Instructions (Signed)
° °*  LOOP RECORDER INSTRUCTIONS GIVEN*

## 2017-11-19 NOTE — Interval H&P Note (Signed)
History and Physical Interval Note:  11/19/2017 8:06 AM  James Owen  has presented today for surgery, with the diagnosis of afib  The various methods of treatment have been discussed with the patient and family. After consideration of risks, benefits and other options for treatment, the patient has consented to  Procedure(s): LOOP RECORDER INSERTION (N/A) as a surgical intervention .  The patient's history has been reviewed, patient examined, no change in status, stable for surgery.  I have reviewed the patient's chart and labs.  Questions were answered to the patient's satisfaction.     Virl Axe

## 2017-12-03 ENCOUNTER — Ambulatory Visit (INDEPENDENT_AMBULATORY_CARE_PROVIDER_SITE_OTHER): Payer: Self-pay | Admitting: *Deleted

## 2017-12-03 DIAGNOSIS — I4891 Unspecified atrial fibrillation: Secondary | ICD-10-CM

## 2017-12-03 LAB — CUP PACEART INCLINIC DEVICE CHECK
Implantable Pulse Generator Implant Date: 20190306
MDC IDC SESS DTM: 20190320141558

## 2017-12-03 NOTE — Progress Notes (Signed)
Wound Loop check in clinic. Steri-Strips removed, incision edges approximated, no redness or edema, wound well healed. Battery status: Good. R-waves 0.50 mV. 0 symptom episodes, 0 tachy episodes, 0 pause episodes, 0 brady episodes. 0 AF episodes. Monthly summary reports and ROV with SK 10/2018.

## 2017-12-22 ENCOUNTER — Ambulatory Visit (INDEPENDENT_AMBULATORY_CARE_PROVIDER_SITE_OTHER): Payer: PPO | Admitting: *Deleted

## 2017-12-22 DIAGNOSIS — I4891 Unspecified atrial fibrillation: Secondary | ICD-10-CM | POA: Diagnosis not present

## 2017-12-23 NOTE — Progress Notes (Signed)
Carelink Summary Report / Loop Recorder 

## 2018-01-05 ENCOUNTER — Other Ambulatory Visit (HOSPITAL_COMMUNITY): Payer: Self-pay | Admitting: Nurse Practitioner

## 2018-01-23 LAB — CUP PACEART REMOTE DEVICE CHECK
Implantable Pulse Generator Implant Date: 20190306
MDC IDC SESS DTM: 20190408133805

## 2018-01-26 ENCOUNTER — Ambulatory Visit (INDEPENDENT_AMBULATORY_CARE_PROVIDER_SITE_OTHER): Payer: PPO | Admitting: *Deleted

## 2018-01-26 DIAGNOSIS — I4891 Unspecified atrial fibrillation: Secondary | ICD-10-CM | POA: Diagnosis not present

## 2018-01-26 NOTE — Progress Notes (Signed)
Carelink Summary Report / Loop Recorder 

## 2018-02-18 LAB — CUP PACEART REMOTE DEVICE CHECK
Implantable Pulse Generator Implant Date: 20190306
MDC IDC SESS DTM: 20190511140906

## 2018-02-23 ENCOUNTER — Telehealth: Payer: Self-pay | Admitting: *Deleted

## 2018-02-23 NOTE — Telephone Encounter (Signed)
Spoke with patient regarding tachy episode on 02/16/18-- ECG appears SVT, duration 9 seconds, Max V rate 188 bpm. Patient states he was asymptomatic and can't recall he was doing anything strenuous. Advised patient will place episode in Dr. Olin Pia folder for review. Patient verbalized understanding and appreciation.

## 2018-02-26 ENCOUNTER — Ambulatory Visit (INDEPENDENT_AMBULATORY_CARE_PROVIDER_SITE_OTHER): Payer: PPO | Admitting: *Deleted

## 2018-02-26 DIAGNOSIS — I4891 Unspecified atrial fibrillation: Secondary | ICD-10-CM

## 2018-02-26 NOTE — Progress Notes (Signed)
Carelink Summary Report / Loop Recorder 

## 2018-03-10 ENCOUNTER — Telehealth: Payer: Self-pay

## 2018-03-10 NOTE — Telephone Encounter (Signed)
LMOVM reminding pt to send remote transmission. Pt monitor has not transmitted in the last 14 days.

## 2018-03-24 ENCOUNTER — Telehealth: Payer: Self-pay | Admitting: Cardiology

## 2018-03-24 NOTE — Telephone Encounter (Signed)
Spoke w/ pt and requested that he send a manual transmission b/c his home monitor has not updated in at least 14 days.   

## 2018-03-31 ENCOUNTER — Ambulatory Visit (INDEPENDENT_AMBULATORY_CARE_PROVIDER_SITE_OTHER): Payer: PPO | Admitting: *Deleted

## 2018-03-31 DIAGNOSIS — I4891 Unspecified atrial fibrillation: Secondary | ICD-10-CM | POA: Diagnosis not present

## 2018-03-31 NOTE — Progress Notes (Signed)
Carelink Summary Report / Loop Recorder 

## 2018-04-02 ENCOUNTER — Other Ambulatory Visit: Payer: Self-pay | Admitting: Internal Medicine

## 2018-04-03 LAB — CUP PACEART REMOTE DEVICE CHECK
Date Time Interrogation Session: 20190613154000
MDC IDC PG IMPLANT DT: 20190306

## 2018-04-09 DIAGNOSIS — E785 Hyperlipidemia, unspecified: Secondary | ICD-10-CM | POA: Diagnosis not present

## 2018-04-09 DIAGNOSIS — E039 Hypothyroidism, unspecified: Secondary | ICD-10-CM | POA: Diagnosis not present

## 2018-04-09 DIAGNOSIS — Z0000 Encounter for general adult medical examination without abnormal findings: Secondary | ICD-10-CM | POA: Diagnosis not present

## 2018-04-13 DIAGNOSIS — E785 Hyperlipidemia, unspecified: Secondary | ICD-10-CM | POA: Diagnosis not present

## 2018-04-13 DIAGNOSIS — E039 Hypothyroidism, unspecified: Secondary | ICD-10-CM | POA: Diagnosis not present

## 2018-04-13 DIAGNOSIS — N183 Chronic kidney disease, stage 3 (moderate): Secondary | ICD-10-CM | POA: Diagnosis not present

## 2018-04-13 DIAGNOSIS — Z0000 Encounter for general adult medical examination without abnormal findings: Secondary | ICD-10-CM | POA: Diagnosis not present

## 2018-05-04 ENCOUNTER — Ambulatory Visit (INDEPENDENT_AMBULATORY_CARE_PROVIDER_SITE_OTHER): Payer: PPO | Admitting: *Deleted

## 2018-05-04 DIAGNOSIS — I4891 Unspecified atrial fibrillation: Secondary | ICD-10-CM

## 2018-05-05 NOTE — Progress Notes (Signed)
Carelink Summary Report / Loop Recorder 

## 2018-05-19 LAB — CUP PACEART REMOTE DEVICE CHECK
Date Time Interrogation Session: 20190716153749
Implantable Pulse Generator Implant Date: 20190306

## 2018-06-05 ENCOUNTER — Ambulatory Visit (INDEPENDENT_AMBULATORY_CARE_PROVIDER_SITE_OTHER): Payer: PPO | Admitting: *Deleted

## 2018-06-05 ENCOUNTER — Ambulatory Visit: Payer: PPO | Admitting: *Deleted

## 2018-06-05 DIAGNOSIS — I4891 Unspecified atrial fibrillation: Secondary | ICD-10-CM | POA: Diagnosis not present

## 2018-06-06 NOTE — Progress Notes (Signed)
Error - duplicated chart

## 2018-06-06 NOTE — Progress Notes (Signed)
Carelink Summary Report / Loop Recorder 

## 2018-06-07 LAB — CUP PACEART REMOTE DEVICE CHECK
Date Time Interrogation Session: 20190920164019
Implantable Pulse Generator Implant Date: 20190306

## 2018-06-08 LAB — CUP PACEART REMOTE DEVICE CHECK
Implantable Pulse Generator Implant Date: 20190306
MDC IDC SESS DTM: 20190818160951

## 2018-07-08 ENCOUNTER — Ambulatory Visit (INDEPENDENT_AMBULATORY_CARE_PROVIDER_SITE_OTHER): Payer: PPO | Admitting: *Deleted

## 2018-07-08 DIAGNOSIS — I4891 Unspecified atrial fibrillation: Secondary | ICD-10-CM

## 2018-07-08 NOTE — Progress Notes (Signed)
Carelink Summary Report / Loop Recorder 

## 2018-07-10 DIAGNOSIS — Z23 Encounter for immunization: Secondary | ICD-10-CM | POA: Diagnosis not present

## 2018-07-30 LAB — CUP PACEART REMOTE DEVICE CHECK
Date Time Interrogation Session: 20191023163723
MDC IDC PG IMPLANT DT: 20190306

## 2018-08-10 ENCOUNTER — Ambulatory Visit (INDEPENDENT_AMBULATORY_CARE_PROVIDER_SITE_OTHER): Payer: PPO

## 2018-08-10 DIAGNOSIS — I4891 Unspecified atrial fibrillation: Secondary | ICD-10-CM | POA: Diagnosis not present

## 2018-08-11 NOTE — Progress Notes (Signed)
Carelink Summary Report / Loop Recorder 

## 2018-09-04 ENCOUNTER — Encounter: Payer: Self-pay | Admitting: Internal Medicine

## 2018-09-14 ENCOUNTER — Ambulatory Visit (INDEPENDENT_AMBULATORY_CARE_PROVIDER_SITE_OTHER): Payer: PPO

## 2018-09-14 DIAGNOSIS — I4891 Unspecified atrial fibrillation: Secondary | ICD-10-CM

## 2018-09-14 NOTE — Progress Notes (Signed)
Carelink Summary Report / Loop Recorder 

## 2018-09-15 LAB — CUP PACEART REMOTE DEVICE CHECK
Date Time Interrogation Session: 20191228183636
MDC IDC PG IMPLANT DT: 20190306

## 2018-09-27 LAB — CUP PACEART REMOTE DEVICE CHECK
Date Time Interrogation Session: 20191125173609
MDC IDC PG IMPLANT DT: 20190306

## 2018-10-15 ENCOUNTER — Ambulatory Visit (INDEPENDENT_AMBULATORY_CARE_PROVIDER_SITE_OTHER): Payer: Medicare Other

## 2018-10-15 DIAGNOSIS — I4891 Unspecified atrial fibrillation: Secondary | ICD-10-CM

## 2018-10-16 LAB — CUP PACEART REMOTE DEVICE CHECK
Implantable Pulse Generator Implant Date: 20190306
MDC IDC SESS DTM: 20200130191004

## 2018-10-23 NOTE — Progress Notes (Signed)
Carelink Summary Report / Loop Recorder 

## 2018-11-05 ENCOUNTER — Other Ambulatory Visit: Payer: Self-pay | Admitting: Internal Medicine

## 2018-11-05 ENCOUNTER — Other Ambulatory Visit (HOSPITAL_COMMUNITY): Payer: Self-pay | Admitting: Internal Medicine

## 2018-11-05 ENCOUNTER — Ambulatory Visit
Admission: RE | Admit: 2018-11-05 | Discharge: 2018-11-05 | Disposition: A | Discharge status: Home / Self Care | Payer: Medicare Other | Source: Ambulatory Visit | Attending: Internal Medicine | Admitting: Internal Medicine

## 2018-11-05 DIAGNOSIS — R945 Abnormal results of liver function studies: Secondary | ICD-10-CM

## 2018-11-05 DIAGNOSIS — R7989 Other specified abnormal findings of blood chemistry: Secondary | ICD-10-CM

## 2018-11-05 DIAGNOSIS — R1011 Right upper quadrant pain: Secondary | ICD-10-CM

## 2018-11-05 MED ORDER — IOPAMIDOL (ISOVUE-300) INJECTION 61%
100.0000 mL | Freq: Once | INTRAVENOUS | Status: AC | PRN
  Administered 2018-11-05: 100 mL via INTRAVENOUS

## 2018-11-06 ENCOUNTER — Other Ambulatory Visit: Payer: Self-pay | Admitting: Internal Medicine

## 2018-11-09 ENCOUNTER — Other Ambulatory Visit: Payer: Self-pay | Admitting: Internal Medicine

## 2018-11-09 DIAGNOSIS — R7989 Other specified abnormal findings of blood chemistry: Secondary | ICD-10-CM

## 2018-11-09 DIAGNOSIS — R945 Abnormal results of liver function studies: Secondary | ICD-10-CM

## 2018-11-09 DIAGNOSIS — R17 Unspecified jaundice: Secondary | ICD-10-CM

## 2018-11-09 DIAGNOSIS — R109 Unspecified abdominal pain: Secondary | ICD-10-CM

## 2018-11-10 ENCOUNTER — Inpatient Hospital Stay (HOSPITAL_COMMUNITY)
Admission: EM | Admit: 2018-11-10 | Discharge: 2018-11-12 | DRG: 440 | Disposition: A | Discharge status: Home / Self Care | Payer: Medicare Other | Attending: Family Medicine | Admitting: Family Medicine

## 2018-11-10 ENCOUNTER — Encounter (HOSPITAL_COMMUNITY): Payer: Self-pay

## 2018-11-10 ENCOUNTER — Other Ambulatory Visit: Payer: Self-pay

## 2018-11-10 ENCOUNTER — Telehealth: Payer: Self-pay | Admitting: Physician Assistant

## 2018-11-10 ENCOUNTER — Encounter: Payer: Self-pay | Admitting: Physician Assistant

## 2018-11-10 ENCOUNTER — Inpatient Hospital Stay (HOSPITAL_COMMUNITY): Payer: Medicare Other

## 2018-11-10 DIAGNOSIS — Z885 Allergy status to narcotic agent status: Secondary | ICD-10-CM

## 2018-11-10 DIAGNOSIS — R195 Other fecal abnormalities: Secondary | ICD-10-CM

## 2018-11-10 DIAGNOSIS — R17 Unspecified jaundice: Secondary | ICD-10-CM

## 2018-11-10 DIAGNOSIS — K8050 Calculus of bile duct without cholangitis or cholecystitis without obstruction: Secondary | ICD-10-CM | POA: Diagnosis present

## 2018-11-10 DIAGNOSIS — E039 Hypothyroidism, unspecified: Secondary | ICD-10-CM | POA: Diagnosis present

## 2018-11-10 DIAGNOSIS — K859 Acute pancreatitis without necrosis or infection, unspecified: Secondary | ICD-10-CM | POA: Diagnosis not present

## 2018-11-10 DIAGNOSIS — F17220 Nicotine dependence, chewing tobacco, uncomplicated: Secondary | ICD-10-CM | POA: Diagnosis present

## 2018-11-10 DIAGNOSIS — Z7901 Long term (current) use of anticoagulants: Secondary | ICD-10-CM

## 2018-11-10 DIAGNOSIS — Z803 Family history of malignant neoplasm of breast: Secondary | ICD-10-CM

## 2018-11-10 DIAGNOSIS — I4891 Unspecified atrial fibrillation: Secondary | ICD-10-CM | POA: Diagnosis present

## 2018-11-10 DIAGNOSIS — Z9049 Acquired absence of other specified parts of digestive tract: Secondary | ICD-10-CM

## 2018-11-10 DIAGNOSIS — R7989 Other specified abnormal findings of blood chemistry: Secondary | ICD-10-CM | POA: Diagnosis present

## 2018-11-10 DIAGNOSIS — K3189 Other diseases of stomach and duodenum: Secondary | ICD-10-CM | POA: Diagnosis not present

## 2018-11-10 DIAGNOSIS — Z808 Family history of malignant neoplasm of other organs or systems: Secondary | ICD-10-CM

## 2018-11-10 DIAGNOSIS — Z9889 Other specified postprocedural states: Secondary | ICD-10-CM | POA: Diagnosis not present

## 2018-11-10 DIAGNOSIS — R1013 Epigastric pain: Secondary | ICD-10-CM | POA: Diagnosis present

## 2018-11-10 DIAGNOSIS — N183 Chronic kidney disease, stage 3 (moderate): Secondary | ICD-10-CM | POA: Diagnosis present

## 2018-11-10 DIAGNOSIS — K8020 Calculus of gallbladder without cholecystitis without obstruction: Secondary | ICD-10-CM

## 2018-11-10 DIAGNOSIS — K8031 Calculus of bile duct with cholangitis, unspecified, with obstruction: Secondary | ICD-10-CM | POA: Diagnosis not present

## 2018-11-10 DIAGNOSIS — R945 Abnormal results of liver function studies: Secondary | ICD-10-CM

## 2018-11-10 DIAGNOSIS — K851 Biliary acute pancreatitis without necrosis or infection: Principal | ICD-10-CM | POA: Diagnosis present

## 2018-11-10 DIAGNOSIS — K8309 Other cholangitis: Secondary | ICD-10-CM | POA: Diagnosis not present

## 2018-11-10 DIAGNOSIS — Z79890 Hormone replacement therapy (postmenopausal): Secondary | ICD-10-CM

## 2018-11-10 HISTORY — DX: Chronic kidney disease, unspecified: N18.9

## 2018-11-10 LAB — COMPREHENSIVE METABOLIC PANEL
ALT: 149 U/L — ABNORMAL HIGH (ref 0–44)
AST: 99 U/L — ABNORMAL HIGH (ref 15–41)
Albumin: 3.3 g/dL — ABNORMAL LOW (ref 3.5–5.0)
Alkaline Phosphatase: 119 U/L (ref 38–126)
Anion gap: 6 (ref 5–15)
BUN: 17 mg/dL (ref 8–23)
CO2: 25 mmol/L (ref 22–32)
Calcium: 8.9 mg/dL (ref 8.9–10.3)
Chloride: 105 mmol/L (ref 98–111)
Creatinine, Ser: 1.4 mg/dL — ABNORMAL HIGH (ref 0.61–1.24)
GFR calc Af Amer: 57 mL/min — ABNORMAL LOW (ref >60)
GFR calc non Af Amer: 49 mL/min — ABNORMAL LOW (ref >60)
Glucose, Bld: 103 mg/dL — ABNORMAL HIGH (ref 70–99)
Potassium: 3.9 mmol/L (ref 3.5–5.1)
Sodium: 136 mmol/L (ref 135–145)
Total Bilirubin: 2.9 mg/dL — ABNORMAL HIGH (ref 0.3–1.2)
Total Protein: 6.7 g/dL (ref 6.5–8.1)

## 2018-11-10 LAB — CBC
HCT: 45.8 % (ref 39.0–52.0)
Hemoglobin: 14.3 g/dL (ref 13.0–17.0)
MCH: 29.7 pg (ref 26.0–34.0)
MCHC: 31.2 g/dL (ref 30.0–36.0)
MCV: 95.2 fL (ref 80.0–100.0)
Platelets: 267 10*3/uL (ref 150–400)
RBC: 4.81 MIL/uL (ref 4.22–5.81)
RDW: 12.8 % (ref 11.5–15.5)
WBC: 8.8 10*3/uL (ref 4.0–10.5)
nRBC: 0 % (ref 0.0–0.2)

## 2018-11-10 LAB — HEPATIC FUNCTION PANEL
ALT: 134 U/L — ABNORMAL HIGH (ref 0–44)
AST: 98 U/L — ABNORMAL HIGH (ref 15–41)
Albumin: 3 g/dL — ABNORMAL LOW (ref 3.5–5.0)
Alkaline Phosphatase: 112 U/L (ref 38–126)
BILIRUBIN INDIRECT: 2 mg/dL — AB (ref 0.3–0.9)
Bilirubin, Direct: 3.6 mg/dL — ABNORMAL HIGH (ref 0.0–0.2)
Total Bilirubin: 5.6 mg/dL — ABNORMAL HIGH (ref 0.3–1.2)
Total Protein: 6.2 g/dL — ABNORMAL LOW (ref 6.5–8.1)

## 2018-11-10 LAB — POC OCCULT BLOOD, ED: FECAL OCCULT BLD: NEGATIVE

## 2018-11-10 LAB — MONONUCLEOSIS SCREEN: MONO SCREEN: NEGATIVE

## 2018-11-10 LAB — LIPASE, BLOOD: Lipase: 340 U/L — ABNORMAL HIGH (ref 11–51)

## 2018-11-10 MED ORDER — LEVOTHYROXINE SODIUM 50 MCG PO TABS
50.0000 ug | ORAL_TABLET | Freq: Every day | ORAL | Status: DC
  Administered 2018-11-11: 50 ug via ORAL
  Filled 2018-11-10: qty 1

## 2018-11-10 MED ORDER — HYDROMORPHONE HCL 1 MG/ML IJ SOLN
1.0000 mg | INTRAMUSCULAR | Status: DC | PRN
  Administered 2018-11-10 – 2018-11-11 (×2): 1 mg via INTRAVENOUS
  Filled 2018-11-10 (×2): qty 1

## 2018-11-10 MED ORDER — PANTOPRAZOLE SODIUM 40 MG PO TBEC
40.0000 mg | DELAYED_RELEASE_TABLET | Freq: Every day | ORAL | Status: DC
  Administered 2018-11-10 – 2018-11-12 (×3): 40 mg via ORAL
  Filled 2018-11-10 (×3): qty 1

## 2018-11-10 MED ORDER — POTASSIUM CHLORIDE IN NACL 20-0.9 MEQ/L-% IV SOLN: INTRAVENOUS | Status: DC

## 2018-11-10 MED ORDER — MORPHINE SULFATE (PF) 4 MG/ML IV SOLN
4.0000 mg | Freq: Once | INTRAVENOUS | Status: AC
  Administered 2018-11-10: 4 mg via INTRAVENOUS
  Filled 2018-11-10: qty 1

## 2018-11-10 MED ORDER — SODIUM CHLORIDE 0.9 % IV BOLUS
1000.0000 mL | Freq: Once | INTRAVENOUS | Status: AC
  Administered 2018-11-10: 1000 mL via INTRAVENOUS

## 2018-11-10 MED ORDER — SODIUM CHLORIDE 0.9% FLUSH: 3.0000 mL | Freq: Once | INTRAVENOUS | Status: DC

## 2018-11-10 MED ORDER — FENTANYL CITRATE (PF) 100 MCG/2ML IJ SOLN
100.0000 ug | Freq: Once | INTRAMUSCULAR | Status: AC
  Administered 2018-11-10: 100 ug via INTRAVENOUS
  Filled 2018-11-10: qty 2

## 2018-11-10 MED ORDER — ONDANSETRON HCL 4 MG PO TABS: 4.0000 mg | ORAL_TABLET | Freq: Four times a day (QID) | ORAL | Status: DC | PRN

## 2018-11-10 MED ORDER — LACTATED RINGERS IV BOLUS
1000.0000 mL | Freq: Once | INTRAVENOUS | Status: AC
  Administered 2018-11-10: 1000 mL via INTRAVENOUS

## 2018-11-10 MED ORDER — LACTATED RINGERS IV SOLN
INTRAVENOUS | Status: AC
  Administered 2018-11-10: 250 mL/h via INTRAVENOUS

## 2018-11-10 MED ORDER — ALUM & MAG HYDROXIDE-SIMETH 200-200-20 MG/5ML PO SUSP
30.0000 mL | Freq: Once | ORAL | Status: DC
  Administered 2018-11-10: 30 mL via ORAL
  Filled 2018-11-10: qty 30

## 2018-11-10 MED ORDER — OXYCODONE HCL 5 MG PO TABS: 10.0000 mg | ORAL_TABLET | ORAL | Status: DC | PRN

## 2018-11-10 MED ORDER — GADOBUTROL 1 MMOL/ML IV SOLN
9.0000 mL | Freq: Once | INTRAVENOUS | Status: AC | PRN
  Administered 2018-11-10: 9 mL via INTRAVENOUS

## 2018-11-10 MED ORDER — ACETAMINOPHEN 325 MG PO TABS: 650.0000 mg | ORAL_TABLET | Freq: Four times a day (QID) | ORAL | Status: DC | PRN

## 2018-11-10 MED ORDER — FAMOTIDINE 20 MG PO TABS
40.0000 mg | ORAL_TABLET | Freq: Once | ORAL | Status: DC
  Filled 2018-11-10: qty 2

## 2018-11-10 MED ORDER — LIDOCAINE VISCOUS HCL 2 % MT SOLN
15.0000 mL | Freq: Once | OROMUCOSAL | Status: AC
  Administered 2018-11-10: 15 mL via ORAL
  Filled 2018-11-10: qty 15

## 2018-11-10 MED ORDER — ACETAMINOPHEN 650 MG RE SUPP: 650.0000 mg | Freq: Four times a day (QID) | RECTAL | Status: DC | PRN

## 2018-11-10 MED ORDER — PIPERACILLIN-TAZOBACTAM 3.375 G IVPB
3.3750 g | Freq: Three times a day (TID) | INTRAVENOUS | Status: DC
  Administered 2018-11-11 – 2018-11-12 (×5): 3.375 g via INTRAVENOUS
  Filled 2018-11-10 (×7): qty 50

## 2018-11-10 MED ORDER — LACTATED RINGERS IV BOLUS
500.0000 mL | Freq: Once | INTRAVENOUS | Status: AC
  Administered 2018-11-10: 500 mL via INTRAVENOUS

## 2018-11-10 MED ORDER — FENTANYL CITRATE (PF) 100 MCG/2ML IJ SOLN
50.0000 ug | Freq: Once | INTRAMUSCULAR | Status: AC
  Administered 2018-11-10: 50 ug via INTRAVENOUS
  Filled 2018-11-10: qty 2

## 2018-11-10 MED ORDER — PIPERACILLIN-TAZOBACTAM 3.375 G IVPB 30 MIN
3.3750 g | Freq: Once | INTRAVENOUS | Status: AC
  Administered 2018-11-10: 3.375 g via INTRAVENOUS
  Filled 2018-11-10: qty 50

## 2018-11-10 MED ORDER — ONDANSETRON HCL 4 MG/2ML IJ SOLN
4.0000 mg | Freq: Four times a day (QID) | INTRAMUSCULAR | Status: DC | PRN
  Administered 2018-11-10 – 2018-11-11 (×2): 4 mg via INTRAVENOUS
  Filled 2018-11-10: qty 2

## 2018-11-10 NOTE — ED Notes (Signed)
RN informed Pt can receive visitor  

## 2018-11-10 NOTE — ED Notes (Addendum)
Pt made aware of need for Urine sample. Pt and family asking about pain medicine for the pt. RN, Robbie Lis., notified.

## 2018-11-10 NOTE — ED Triage Notes (Signed)
Pt sent here from PCP for evaluation of abd pain x3 weeks and elevated LFTs with jaundice. Pt had gallbladder removed last year. Intermittent nausea. Pain is worse when he eats.

## 2018-11-10 NOTE — Telephone Encounter (Signed)
Pt is scheduled 3.4.20 with Amy, PA.  Dr. Isidore Moos requested pt to be seen this week.  Please advise scheduling.

## 2018-11-10 NOTE — Consult Note (Signed)
Referring Provider: [facility]  Primary Care Physician:  Merrilee Seashore, MD Primary Gastroenterologist:  Dr. Scarlette Shorts  Reason for Consultation: abdominal pain, jaundice, elevated LFTs  HPI: James Owen is a 73 y.o. male with a past medical history of atrial fibrillation (no longer on Eliquis), s/p insertable cardiac monitor heart 11/2017, hypothyroidism, colon polyps, mild renal insufficiency, s/p cholecystectomy  09/2017 due to gallstones. IOP cholangiogram was not done because his gallbladder was gangrenous. He developed intermittent nausea, vomiting and epigastric pain which started 10/30/2018. No specific food or stress triggers. He was seen by his PCP and he reported his LFTs were elevated. He underwent an abdominal/pelvic CT 11/05/2018 which showed a normal liver, no biliary dilatation, status post cholecystectomy. The pancreas was unremarkable, no evidence of pancreatic duct dilatation or surrounding inflammatory changes.The spleen was normal. Stomach is within normal limits. Appendix appears normal. No evidence of bowel wall thickening, distention, or inflammatory changes. Sigmoid diverticulosis without evidence of acute diverticulitis. CT findings did not explain his pain. He was seen by his PCP for a follow up appointment today to review his A/P CT and labs. He presented with jaundice. Reported continued intermittent N/V and epigastric pain. He was NPO for a lab draw, after the labs were collected he ate a cookie. He then developed severe epigastric pain and he was sent to the ED for further evaluation.   ED Course: He is on a stretcher in the ED Hallway. He demonstrates positive rigors, he vomited bilious emesis, complains of 10 out of 10 epigastric pain.  Labs: Sodium 136.  Potassium 3.9.  Glucose 103.  BUN 17.  Creatinine 1.40.  Alk phos 119.  Albumin 3.3.  Lipase 340.  AST 99.  ALT 149.  Total bili 2.9.  WBC 8.8.  Hemoglobin 14.3.  Hematocrit 45.8.  Platelet 267.   FOBT negative. His wife and daughter are present. LR 500cc IV bolus ordered. Zosyn 3.327m IV ordered.  GI history: colonoscopy 06/22/2013 by Dr. PHenrene Pastor 1 TA polyp removed  Past Medical History:  Diagnosis Date  . [place]   . CKD (chronic kidney disease)   . Thyroid disease     Past Surgical History:  Procedure Laterality Date  . CHOLECYSTECTOMY N/A 09/19/2017   Procedure: LAPAROSCOPIC CHOLECYSTECTOMY;  Surgeon: WIleana Roup MD;  Location: [facility];  Service: General;  Laterality: N/A;  . LOOP RECORDER INSERTION N/A 11/19/2017   Procedure: LOOP RECORDER INSERTION;  Surgeon: KDeboraha Sprang MD;  [facility] [facility];  Service: Cardiovascular;  Laterality: N/A;  . MULTIPLE TOOTH EXTRACTIONS     has all dentures    Prior to Admission medications   Medication Sig Start Date End Date Taking? Authorizing Provider  levothyroxine (SYNTHROID, LEVOTHROID) 50 MCG tablet Take 50 mcg by mouth daily before breakfast.   Yes [provider]  pantoprazole (PROTONIX) 40 MG tablet Take 40 mg by mouth daily. 11/03/18  Yes [provider]  Polyvinyl Alcohol-Povidone (REFRESH OP) Place 1 drop into the left eye daily.   Yes [provider]  apixaban (ELIQUIS) 5 MG TABS tablet Take 1 tablet (5 mg total) by mouth 2 (two) times daily. Patient not taking: Reported on 12/03/2017 10/15/17   CSherran Needs NP    Current Facility-Administered Medications  Medication Dose Route Frequency Provider Last Rate Last Dose  . sodium chloride flush (NS) 0.9 % injection 3 mL  3 mL Intravenous Once KVirgel Manifold MD   Stopped at 11/10/18 1249  Current Outpatient Medications  Medication Sig Dispense Refill  . levothyroxine (SYNTHROID, LEVOTHROID) 50 MCG tablet Take 50 mcg by mouth daily before breakfast.    . pantoprazole (PROTONIX) 40 MG tablet Take 40 mg by mouth daily.    . Polyvinyl Alcohol-Povidone (REFRESH OP) Place 1 drop into the left eye daily.    Marland Kitchen  apixaban (ELIQUIS) 5 MG TABS tablet Take 1 tablet (5 mg total) by mouth 2 (two) times daily. (Patient not taking: Reported on 12/03/2017) 60 tablet 3    Allergies as of 11/10/2018 - Review Complete 11/10/2018  Allergen Reaction Noted  . Vicodin [hydrocodone-acetaminophen] Other (See Comments) 10/07/2017    Family History  Problem Relation Age of Onset  . Melanoma Sister   . Breast cancer Sister     Social History   Socioeconomic History  . Marital status: Married    Spouse name: Not on file  . Number of children: Not on file  . Years of education: Not on file  . Highest education level: Not on file  Occupational History  . Not on file  Social Needs  . Financial resource strain: Not on file  . Food insecurity:    Worry: Not on file    Inability: Not on file  . Transportation needs:    Medical: Not on file    Non-medical: Not on file  Tobacco Use  . Smoking status: Never Smoker  . Smokeless tobacco: Current User    Types: Chew  . Tobacco comment: occasionally  Substance and Sexual Activity  . Alcohol use: Yes    Comment: SELDOM  . Drug use: No  . Sexual activity: Not on file  Lifestyle  . Physical activity:    Days per week: Not on file    Minutes per session: Not on file  . Stress: Not on file  Relationships  . Social connections:    Talks on phone: Not on file    Gets together: Not on file    Attends religious service: Not on file    Active member of club or organization: Not on file    Attends meetings of clubs or organizations: Not on file    Relationship status: Not on file  . Intimate partner violence:    Fear of current or ex partner: Not on file    Emotionally abused: Not on file    Physically abused: Not on file    Forced sexual activity: Not on file  Other Topics Concern  . Not on file  Social History Narrative  . Not on file    Review of Systems: Gen: + chills, no fever CV: Denies chest pain, angina, palpitations, syncope, orthopnea, PND,  peripheral edema, and claudication. Resp: Denies CP or SOB. GI: See HPI. GU : Denies urinary burning, blood in urine, urinary frequency, urinary hesitancy, nocturnal urination, and urinary incontinence. MS:new joint pain. Derm: + rash around mouth.  Heme: Denies bruising, bleeding, and enlarged lymph nodes. Neuro:  Denies any headaches  Physical Exam: Vital signs in last 24 hours: Temp:  [98.4 F (36.9 C)] 98.4 F (36.9 C) (02/25 1158) Pulse Rate:  [55-79] 62 (02/25 1530) Resp:  [16] 16 (02/25 1530) BP: (103-140)/(64-86) 103/64 (02/25 1530) SpO2:  [96 %-99 %] 96 % (02/25 1530)   General:   Alert, in distress with rigors  Head:  Normocephalic and atraumatic. Eyes:  Sclera icteric  Lungs:  Clear throughout to auscultation.   No wheezes, crackles, or rhonchi.  Heart:  Regular rate and rhythm;  no murmurs, clicks, rubs,  or gallops. Implantable heart monitor left chest wall palpated. Abdomen:  Soft, severe epigastric tenderness without rebound or guarding, abdomen is soft, +BS x 4 quads. Rectal:  Deferred. FOBT negative. Pulses:  Normal pulses noted. Extremities:  Without clubbing or edema. Neurologic:  Alert and  oriented x4;  grossly normal neurologically. Skin:  Erythematous papular perioral dermatitis  Psych:  Alert and cooperative. Normal mood and affect.  Intake/Output from previous day: No intake/output data recorded. Intake/Output this shift: No intake/output data recorded.  Lab Results: Recent Labs    11/10/18 1240  WBC 8.8  HGB 14.3  HCT 45.8  PLT 267   BMET Recent Labs    11/10/18 1240  NA 136  K 3.9  CL 105  CO2 25  GLUCOSE 103*  BUN 17  CREATININE 1.40*  CALCIUM 8.9   LFT Recent Labs    11/10/18 1240  PROT 6.7  ALBUMIN 3.3*  AST 99*  ALT 149*  ALKPHOS 119  BILITOT 2.9*     IMPRESSION/PLAN  1. Epigastric pain with elevated LFTs and Lipase, patient with rigors and vomiting, most likely choledocholithiasis, potential developing  cholangitis. Patient is currently afebrile. WBC 8.8.  -stat MRCP/abdominal MRI (patient has implantable cardiac monitor that is acceptable for MRI environment) -may need emergent ERCP, await MRCP results -agree with Zosyn IV for now -LR 500cc IV bolus then LR 200cch/hr x 4 hours then 100cc/hr -NPO -pain management per hospitalist -repeat CBC, CMP, lipase in am, if abdominal pain worsens repeat labs as needed -consider acute hepatitis screen if MRCP negative  -further recommendations per Dr. Corena Pilgrim Dorathy Daft  11/10/2018, 3:56 PM

## 2018-11-10 NOTE — ED Notes (Signed)
Pt returned back to room.

## 2018-11-10 NOTE — H&P (View-Only) (Signed)
Referring Provider: [facility]  Primary Care Physician:  Merrilee Seashore, MD Primary Gastroenterologist:  Dr. Scarlette Shorts  Reason for Consultation: abdominal pain, jaundice, elevated LFTs  HPI: James Owen is a 73 y.o. male with a past medical history of atrial fibrillation (no longer on Eliquis), s/p insertable cardiac monitor heart 11/2017, hypothyroidism, colon polyps, mild renal insufficiency, s/p cholecystectomy  09/2017 due to gallstones. IOP cholangiogram was not done because his gallbladder was gangrenous. He developed intermittent nausea, vomiting and epigastric pain which started 10/30/2018. No specific food or stress triggers. He was seen by his PCP and he reported his LFTs were elevated. He underwent an abdominal/pelvic CT 11/05/2018 which showed a normal liver, no biliary dilatation, status post cholecystectomy. The pancreas was unremarkable, no evidence of pancreatic duct dilatation or surrounding inflammatory changes.The spleen was normal. Stomach is within normal limits. Appendix appears normal. No evidence of bowel wall thickening, distention, or inflammatory changes. Sigmoid diverticulosis without evidence of acute diverticulitis. CT findings did not explain his pain. He was seen by his PCP for a follow up appointment today to review his A/P CT and labs. He presented with jaundice. Reported continued intermittent N/V and epigastric pain. He was NPO for a lab draw, after the labs were collected he ate a cookie. He then developed severe epigastric pain and he was sent to the ED for further evaluation.   ED Course: He is on a stretcher in the ED Hallway. He demonstrates positive rigors, he vomited bilious emesis, complains of 10 out of 10 epigastric pain.  Labs: Sodium 136.  Potassium 3.9.  Glucose 103.  BUN 17.  Creatinine 1.40.  Alk phos 119.  Albumin 3.3.  Lipase 340.  AST 99.  ALT 149.  Total bili 2.9.  WBC 8.8.  Hemoglobin 14.3.  Hematocrit 45.8.  Platelet 267.   FOBT negative. His wife and daughter are present. LR 500cc IV bolus ordered. Zosyn 3.327m IV ordered.  GI history: colonoscopy 06/22/2013 by Dr. PHenrene Pastor 1 TA polyp removed  Past Medical History:  Diagnosis Date  . [place]   . CKD (chronic kidney disease)   . Thyroid disease     Past Surgical History:  Procedure Laterality Date  . CHOLECYSTECTOMY N/A 09/19/2017   Procedure: LAPAROSCOPIC CHOLECYSTECTOMY;  Surgeon: WIleana Roup MD;  Location: [facility];  Service: General;  Laterality: N/A;  . LOOP RECORDER INSERTION N/A 11/19/2017   Procedure: LOOP RECORDER INSERTION;  Surgeon: KDeboraha Sprang MD;  [place] [facility];  Service: Cardiovascular;  Laterality: N/A;  . MULTIPLE TOOTH EXTRACTIONS     has all dentures    Prior to Admission medications   Medication Sig Start Date End Date Taking? Authorizing Provider  levothyroxine (SYNTHROID, LEVOTHROID) 50 MCG tablet Take 50 mcg by mouth daily before breakfast.   Yes [provider]  pantoprazole (PROTONIX) 40 MG tablet Take 40 mg by mouth daily. 11/03/18  Yes [provider]  Polyvinyl Alcohol-Povidone (REFRESH OP) Place 1 drop into the left eye daily.   Yes [provider]  apixaban (ELIQUIS) 5 MG TABS tablet Take 1 tablet (5 mg total) by mouth 2 (two) times daily. Patient not taking: Reported on 12/03/2017 10/15/17   CSherran Needs NP    Current Facility-Administered Medications  Medication Dose Route Frequency Provider Last Rate Last Dose  . sodium chloride flush (NS) 0.9 % injection 3 mL  3 mL Intravenous Once KVirgel Manifold MD   Stopped at 11/10/18 1249  Current Outpatient Medications  Medication Sig Dispense Refill  . levothyroxine (SYNTHROID, LEVOTHROID) 50 MCG tablet Take 50 mcg by mouth daily before breakfast.    . pantoprazole (PROTONIX) 40 MG tablet Take 40 mg by mouth daily.    . Polyvinyl Alcohol-Povidone (REFRESH OP) Place 1 drop into the left eye daily.    .  apixaban (ELIQUIS) 5 MG TABS tablet Take 1 tablet (5 mg total) by mouth 2 (two) times daily. (Patient not taking: Reported on 12/03/2017) 60 tablet 3    Allergies as of 11/10/2018 - Review Complete 11/10/2018  Allergen Reaction Noted  . Vicodin [hydrocodone-acetaminophen] Other (See Comments) 10/07/2017    Family History  Problem Relation Age of Onset  . Melanoma Sister   . Breast cancer Sister     Social History   Socioeconomic History  . Marital status: Married    Spouse name: Not on file  . Number of children: Not on file  . Years of education: Not on file  . Highest education level: Not on file  Occupational History  . Not on file  Social Needs  . Financial resource strain: Not on file  . Food insecurity:    Worry: Not on file    Inability: Not on file  . Transportation needs:    Medical: Not on file    Non-medical: Not on file  Tobacco Use  . Smoking status: Never Smoker  . Smokeless tobacco: Current User    Types: Chew  . Tobacco comment: occasionally  Substance and Sexual Activity  . Alcohol use: Yes    Comment: SELDOM  . Drug use: No  . Sexual activity: Not on file  Lifestyle  . Physical activity:    Days per week: Not on file    Minutes per session: Not on file  . Stress: Not on file  Relationships  . Social connections:    Talks on phone: Not on file    Gets together: Not on file    Attends religious service: Not on file    Active member of club or organization: Not on file    Attends meetings of clubs or organizations: Not on file    Relationship status: Not on file  . Intimate partner violence:    Fear of current or ex partner: Not on file    Emotionally abused: Not on file    Physically abused: Not on file    Forced sexual activity: Not on file  Other Topics Concern  . Not on file  Social History Narrative  . Not on file    Review of Systems: Gen: + chills, no fever CV: Denies chest pain, angina, palpitations, syncope, orthopnea, PND,  peripheral edema, and claudication. Resp: Denies CP or SOB. GI: See HPI. GU : Denies urinary burning, blood in urine, urinary frequency, urinary hesitancy, nocturnal urination, and urinary incontinence. MS:new joint pain. Derm: + rash around mouth.  Heme: Denies bruising, bleeding, and enlarged lymph nodes. Neuro:  Denies any headaches  Physical Exam: Vital signs in last 24 hours: Temp:  [98.4 F (36.9 C)] 98.4 F (36.9 C) (02/25 1158) Pulse Rate:  [55-79] 62 (02/25 1530) Resp:  [16] 16 (02/25 1530) BP: (103-140)/(64-86) 103/64 (02/25 1530) SpO2:  [96 %-99 %] 96 % (02/25 1530)   General:   Alert, in distress with rigors  Head:  Normocephalic and atraumatic. Eyes:  Sclera icteric  Lungs:  Clear throughout to auscultation.   No wheezes, crackles, or rhonchi.  Heart:  Regular rate and rhythm;   no murmurs, clicks, rubs,  or gallops. Implantable heart monitor left chest wall palpated. Abdomen:  Soft, severe epigastric tenderness without rebound or guarding, abdomen is soft, +BS x 4 quads. Rectal:  Deferred. FOBT negative. Pulses:  Normal pulses noted. Extremities:  Without clubbing or edema. Neurologic:  Alert and  oriented x4;  grossly normal neurologically. Skin:  Erythematous papular perioral dermatitis  Psych:  Alert and cooperative. Normal mood and affect.  Intake/Output from previous day: No intake/output data recorded. Intake/Output this shift: No intake/output data recorded.  Lab Results: Recent Labs    11/10/18 1240  WBC 8.8  HGB 14.3  HCT 45.8  PLT 267   BMET Recent Labs    11/10/18 1240  NA 136  K 3.9  CL 105  CO2 25  GLUCOSE 103*  BUN 17  CREATININE 1.40*  CALCIUM 8.9   LFT Recent Labs    11/10/18 1240  PROT 6.7  ALBUMIN 3.3*  AST 99*  ALT 149*  ALKPHOS 119  BILITOT 2.9*     IMPRESSION/PLAN  1. Epigastric pain with elevated LFTs and Lipase, patient with rigors and vomiting, most likely choledocholithiasis, potential developing  cholangitis. Patient is currently afebrile. WBC 8.8.  -stat MRCP/abdominal MRI (patient has implantable cardiac monitor that is acceptable for MRI environment) -may need emergent ERCP, await MRCP results -agree with Zosyn IV for now -LR 500cc IV bolus then LR 200cch/hr x 4 hours then 100cc/hr -NPO -pain management per hospitalist -repeat CBC, CMP, lipase in am, if abdominal pain worsens repeat labs as needed -consider acute hepatitis screen if MRCP negative  -further recommendations per Dr. Corena Pilgrim Dorathy Daft  11/10/2018, 3:56 PM

## 2018-11-10 NOTE — ED Notes (Signed)
This NT discussed the use of progression beds in hallway provided with privacy with no change in care, pt denied and didn't want to be in the ruckus. Pt's daughter asked if this NT could allow for pain meds to work, this NT agreed and asked for 15 minutes family agreed. This NT came back at 1450 and pt's daughter asked for this NT to come outside to discuss the neglect to take pt in hallway. Pt's daughter stated "if [facility] is to provide exceptional care, taking the pt in hallway isn't exceptional". Pt started to bring this NT family into conversation and stated "you wouldn't want your family member in the hallway would you?" This NT asked visitor if they would like to talk to charge nurse, pt acknowledged. Notified Jamie(RN)

## 2018-11-10 NOTE — Telephone Encounter (Signed)
Call back.  Friday 11/13/18 at 1:45 pm with Ellouise Newer, PA. That is the best I can do. Thanks

## 2018-11-10 NOTE — ED Provider Notes (Signed)
[place] EMERGENCY DEPARTMENT Provider Note   CSN: [id number] Arrival date & time: 11/10/18  1148    History   Chief Complaint Chief Complaint  Patient presents with  . Abdominal Pain    HPI James Owen is a 73 y.o. male who presents with abdominal pain. PMH significant for A.fib not anticoagulated, hx of gangrenous cholecystitis s/p cholecystectomy with probable choledocholithiasis. His daughter is at bedside. He started having chills around 2/14 along with intermittent epigastric abdominal pain as well. It feels similar as to when he had to have his gallbladder out last year. It feels like an "air compressor" and there is a lot of bloating. Eating makes it worse but not every time.  He's tried Pepto-bismol and Protonix without relief. Not eating makes it better but he is hungry.  He is also been having black stools for the past 3 weeks.  This was initially attributed to the [facility] but even after he stopped this he was having black stools.  He went to [facility] on 2/18 and he was noted to have abnormal LFTs and bilirubin.  A CT of his abdomen and pelvis was ordered along with an ultrasound which were normal.  He states he is lost about 12 pounds.  He was at his doctor's office today and repeat of his LFTs showed it was improving but as soon as he tried to eat an oatmeal cookie he developed severe pain again and therefore he came to the emergency department.  Of note the patient did not undergo a cholangiogram during his surgery last year or MRCP because of a short cystic duct and "tenous nature of the tissue" and his LFTs were improving after surgery.     HPI  Past Medical History:  Diagnosis Date  . Atrial fibrillation ([place])   . CKD (chronic kidney disease)   . Thyroid disease     Patient Active Problem List   Diagnosis Date Noted  . Atrial fibrillation ([place]) 11/19/2017  . Choledocholithiasis 09/19/2017  . S/P laparoscopic cholecystectomy  09/19/2017  . Cholecystitis   . RUQ pain   . Elevated bilirubin     Past Surgical History:  Procedure Laterality Date  . CHOLECYSTECTOMY N/A 09/19/2017   Procedure: LAPAROSCOPIC CHOLECYSTECTOMY;  Surgeon: Ileana Roup, MD;  Location: [facility];  Service: General;  Laterality: N/A;  . LOOP RECORDER INSERTION N/A 11/19/2017   Procedure: LOOP RECORDER INSERTION;  Surgeon: Deboraha Sprang, MD;  Location: [place] [facility];  Service: Cardiovascular;  Laterality: N/A;  . MULTIPLE TOOTH EXTRACTIONS     has all dentures        Home Medications    Prior to Admission medications   Medication Sig Start Date End Date Taking? Authorizing Provider  acetaminophen (TYLENOL) 325 MG tablet Take 650 mg by mouth every 6 (six) hours as needed for moderate pain or headache.    [provider]  apixaban (ELIQUIS) 5 MG TABS tablet Take 1 tablet (5 mg total) by mouth 2 (two) times daily. Patient not taking: Reported on 12/03/2017 10/15/17   Sherran Needs, NP  levothyroxine (SYNTHROID, LEVOTHROID) 50 MCG tablet Take 50 mcg by mouth daily before breakfast.    [provider]  Multiple Vitamins-Minerals (PRESERVISION AREDS 2 PO) Take 1 capsule by mouth 2 (two) times daily.    [provider]    Family History Family History  Problem Relation Age of Onset  . Melanoma Sister   . Breast cancer Sister  Social History Social History   Tobacco Use  . Smoking status: Never Smoker  . Smokeless tobacco: Current User    Types: Chew  . Tobacco comment: occasionally  Substance Use Topics  . Alcohol use: Yes    Comment: SELDOM  . Drug use: No     Allergies   Vicodin [hydrocodone-acetaminophen]   Review of Systems Review of Systems  Constitutional: Positive for chills. Negative for appetite change and fever.  Respiratory: Negative for shortness of breath.   Cardiovascular: Negative for chest pain.  Gastrointestinal: Positive for abdominal pain, nausea and vomiting.  Negative for anal bleeding and diarrhea.  Genitourinary: Negative for dysuria and frequency.  Musculoskeletal: Negative for back pain.  Neurological: Negative for headaches.  All other systems reviewed and are negative.    Physical Exam Updated Vital Signs BP 134/84   Pulse 64   Temp 98.4 F (36.9 C) (Oral)   Resp 16   SpO2 98%   Physical Exam Vitals signs and nursing note reviewed.  Constitutional:      General: He is not in acute distress.    Appearance: He is well-developed.     Comments: Calm, cooperative.  Jaundiced appearing.  HENT:     Head: Normocephalic and atraumatic.  Eyes:     General: No scleral icterus.       Right eye: No discharge.        Left eye: No discharge.     Conjunctiva/sclera: Conjunctivae normal.     Pupils: Pupils are equal, round, and reactive to light.  Neck:     Musculoskeletal: Normal range of motion.  Cardiovascular:     Rate and Rhythm: Normal rate and regular rhythm.  Pulmonary:     Effort: Pulmonary effort is normal. No respiratory distress.     Breath sounds: Normal breath sounds.  Abdominal:     General: Abdomen is flat. Bowel sounds are normal. There is no distension.     Palpations: Abdomen is soft.     Tenderness: There is abdominal tenderness in the epigastric area.     Hernia: A hernia is present. Hernia is present in the ventral area.  Genitourinary:    Comments: Rectal: No melena Skin:    General: Skin is warm and dry.     Coloration: Skin is jaundiced.  Neurological:     Mental Status: He is alert and oriented to person, place, and time.  Psychiatric:        Behavior: Behavior normal.      ED Treatments / Results  Labs (all labs ordered are listed, but only abnormal results are displayed) Labs Reviewed  LIPASE, BLOOD - Abnormal; Notable for the following components:      Result Value   Lipase 340 (*)    All other components within normal limits  COMPREHENSIVE METABOLIC PANEL - Abnormal; Notable for the  following components:   Glucose, Bld 103 (*)    Creatinine, Ser 1.40 (*)    Albumin 3.3 (*)    AST 99 (*)    ALT 149 (*)    Total Bilirubin 2.9 (*)    GFR calc non Af Amer 49 (*)    GFR calc Af Amer 57 (*)    All other components within normal limits  CBC  MONONUCLEOSIS SCREEN  URINALYSIS, ROUTINE W REFLEX MICROSCOPIC  POC OCCULT BLOOD, ED    EKG None  Radiology No results found.  Procedures Procedures (including critical care time)  Medications Ordered in ED Medications  sodium chloride  flush (NS) 0.9 % injection 3 mL (0 mLs Intravenous Hold 11/10/18 1249)  fentaNYL (SUBLIMAZE) injection 100 mcg (has no administration in time range)  lidocaine (XYLOCAINE) 2 % viscous mouth solution 15 mL (15 mLs Oral Given 11/10/18 1503)  fentaNYL (SUBLIMAZE) injection 50 mcg (50 mcg Intravenous Given 11/10/18 1429)  sodium chloride 0.9 % bolus 1,000 mL (1,000 mLs Intravenous New Bag/Given 11/10/18 1457)     Initial Impression / Assessment and Plan / ED Course  I have reviewed the triage vital signs and the nursing notes.  Pertinent labs & imaging results that were available during my care of the patient were reviewed by me and considered in my medical decision making (see chart for details).  73 year old male presents with abdominal pain.  His vital signs are normal here.  On exam he is tender in the epigastric area.  He is notably jaundiced.  He is reporting multiple black stools however Hemoccult was negative.  I reviewed labs and imaging from [place]. On 2/18 his AST was 124, ALT 202, bilirubin 2.6. Lipase was 61. Today his AST is 99, ALT 149, bilirubin 2.9, and notably lipase is 340. Creatinine is 1.4 which is consistent with his CKD. CBC is normal. Will discuss with GI.  Spoke with NP on for [facility]. She will come to see pt. Spoke with Dr. Wynelle Cleveland with [facility]. She will see pt.   Of note, family has been upset because pain has been difficult to control despite IV narcotics and they did  not want the patient moved to the hallway. Charge RN and Dr. Wilson Singer has discussion with family.  Final Clinical Impressions(s) / ED Diagnoses   Final diagnoses:  Acute pancreatitis, unspecified complication status, unspecified pancreatitis type  Jaundice    ED Discharge Orders    None       Recardo Evangelist, PA-C 11/10/18 1619    Virgel Manifold, MD 11/11/18 [phone]

## 2018-11-10 NOTE — ED Notes (Signed)
GI at bedside

## 2018-11-10 NOTE — ED Provider Notes (Signed)
72yM with abdominal pain. Waxing/waning for several weeks. Elevated LFTs recently. Pain worse post prandially. S/p cholecystectomy. Lipase is elevated. Possible choledocholithiasis. Afebrile. No leukocytosis.   Unfortunately, my conversation with pt/family was continually steered towards pt's bed being moved into the hall. I explained the process with them as other staff members have. I explained his medical care won't change. I explained the reason he got into that room was because the prior patient was moved into the hall. I explained the rationale to now move him is so that we can continue to treat new patients. He says he would rather leave than go in the hall. I think that's a poor decision but he is free to make it. I explained that patient stubbornness is not an indication to transfer him to another facility but that he can leave and go anywhere he would like.    James Manifold, MD 11/10/18 [phone]

## 2018-11-10 NOTE — ED Notes (Signed)
Pt transported to MRI 

## 2018-11-10 NOTE — H&P (Addendum)
History and Physical    James Owen  ASN:[id number]  DOB: 03-06-1946  DOA: 11/10/2018 PCP: James Seashore, MD   Patient coming from: home  Chief Complaint: abdominal pain, jaundice  HPI: James Owen is a 73 y.o. male with medical history of hypothyroidism and CKD 3 who presents with abdominal pain for 2 wks which is worse now and is present when he eats. He has had a cholecystectomy last year and recently he has elevated LFTs. In ED today, he has persistently elevated LFTs and an elevated Lipase. He had an ultrasound and a CT scan of his abdomen as outpt and these did not reveal and etiology for his symptoms and abnormal lab findings.  Currently he is vomiting and shaking either due to pain or rigors. He is unable to give a history.   ED Course:  Elevated LFTs and Lipase  Review of Systems:  All other systems reviewed and apart from HPI, are negative.  Past Medical History:  Diagnosis Date  . Atrial fibrillation ([place])   . CKD (chronic kidney disease)   . Thyroid disease     Past Surgical History:  Procedure Laterality Date  . CHOLECYSTECTOMY N/A 09/19/2017   Procedure: LAPAROSCOPIC CHOLECYSTECTOMY;  Surgeon: James Roup, MD;  Location: [facility];  Service: General;  Laterality: N/A;  . LOOP RECORDER INSERTION N/A 11/19/2017   Procedure: LOOP RECORDER INSERTION;  Surgeon: James Sprang, MD;  Location: [place] [facility];  Service: Cardiovascular;  Laterality: N/A;  . MULTIPLE TOOTH EXTRACTIONS     has all dentures    Social History:   reports that he has never smoked. His smokeless tobacco use includes chew. He reports current alcohol use. He reports that he does not use drugs.  Allergies  Allergen Reactions  . Vicodin [Hydrocodone-Acetaminophen] Other (See Comments)    Hallucinations    Family History  Problem Relation Age of Onset  . Melanoma Sister   . Breast cancer Sister      Prior to Admission medications   Medication Sig  Start Date End Date Taking? Authorizing Provider  levothyroxine (SYNTHROID, LEVOTHROID) 50 MCG tablet Take 50 mcg by mouth daily before breakfast.   Yes [provider]  pantoprazole (PROTONIX) 40 MG tablet Take 40 mg by mouth daily. 11/03/18  Yes [provider]  Polyvinyl Alcohol-Povidone (REFRESH OP) Place 1 drop into the left eye daily.   Yes [provider]  apixaban (ELIQUIS) 5 MG TABS tablet Take 1 tablet (5 mg total) by mouth 2 (two) times daily. Patient not taking: Reported on 12/03/2017 10/15/17   James Needs, NP    Physical Exam: Wt Readings from Last 3 Encounters:  11/19/17 89.4 kg  11/11/17 89.4 kg  10/07/17 90.3 kg   Vitals:   11/10/18 1400 11/10/18 1430 11/10/18 1500 11/10/18 1530  BP: 130/81 134/72 140/80 103/64  Pulse: (!) 56 (!) 55 79 62  Resp: 16  16 16   Temp:      TempSrc:      SpO2: 96% 97% 97% 96%      Constitutional:  Calm & comfortable Eyes: PERRLA, lids and conjunctivae normal ENT:  Mucous membranes are moist.  Pharynx clear of exudate   Normal dentition.  Neck: Supple, no masses  Respiratory:  Clear to auscultation bilaterally  Normal respiratory effort.  Cardiovascular:  S1 & S2 heard, regular rate and rhythm No Murmurs Abdomen:  Non distended  tenderness in epigastrium No masses Bowel sounds normal Extremities:  No clubbing /  cyanosis No pedal edema No joint deformity    Skin:  No rashes, lesions or ulcers Neurologic:  AAO x 3 CN 2-12 grossly intact Sensation intact Strength 5/5 in all 4 extremities Psychiatric:  Normal Mood and affect    Labs on Admission: I have personally reviewed following labs and imaging studies  CBC: Recent Labs  Lab 11/10/18 1240  WBC 8.8  HGB 14.3  HCT 45.8  MCV 95.2  PLT 128   Basic Metabolic Panel: Recent Labs  Lab 11/10/18 1240  NA 136  K 3.9  CL 105  CO2 25  GLUCOSE 103*  BUN 17  CREATININE 1.40*  CALCIUM 8.9   GFR: CrCl cannot be calculated  (Unknown ideal weight.). Liver Function Tests: Recent Labs  Lab 11/10/18 1240  AST 99*  ALT 149*  ALKPHOS 119  BILITOT 2.9*  PROT 6.7  ALBUMIN 3.3*   Recent Labs  Lab 11/10/18 1240  LIPASE 340*   No results for input(s): AMMONIA in the last 168 hours. Coagulation Profile: No results for input(s): INR, PROTIME in the last 168 hours. Cardiac Enzymes: No results for input(s): CKTOTAL, CKMB, CKMBINDEX, TROPONINI in the last 168 hours. BNP (last 3 results) No results for input(s): PROBNP in the last 8760 hours. HbA1C: No results for input(s): HGBA1C in the last 72 hours. CBG: No results for input(s): GLUCAP in the last 168 hours. Lipid Profile: No results for input(s): CHOL, HDL, LDLCALC, TRIG, CHOLHDL, LDLDIRECT in the last 72 hours. Thyroid Function Tests: No results for input(s): TSH, T4TOTAL, FREET4, T3FREE, THYROIDAB in the last 72 hours. Anemia Panel: No results for input(s): VITAMINB12, FOLATE, FERRITIN, TIBC, IRON, RETICCTPCT in the last 72 hours. Urine analysis:    Component Value Date/Time   COLORURINE YELLOW 09/19/2017 0014   APPEARANCEUR CLEAR 09/19/2017 0014   LABSPEC >1.046 (H) 09/19/2017 0014   PHURINE 5.0 09/19/2017 0014   GLUCOSEU NEGATIVE 09/19/2017 0014   HGBUR LARGE (A) 09/19/2017 0014   BILIRUBINUR NEGATIVE 09/19/2017 0014   KETONESUR NEGATIVE 09/19/2017 0014   PROTEINUR 30 (A) 09/19/2017 0014   NITRITE NEGATIVE 09/19/2017 0014   LEUKOCYTESUR NEGATIVE 09/19/2017 0014   Sepsis Labs: @LABRCNTIP (procalcitonin:4,lacticidven:4) )No results found for this or any previous visit (from the past 240 hour(s)).   Radiological Exams on Admission: No results found.  EKG: Independently reviewed. NSR  Assessment/Plan Principal Problem:   Elevated LFTs - ? Bile duct stone or acute pancreatitis - start Zosyn, anti-emetics, IV pain medications - NPO for now - Owen is here evaluating the patient and will order an MRCP  Active Problems:     Hypothyroidism - cont synthroid  CKD3 - stable- follow   DVT prophylaxis: SCDs  Code Status: Full code  Family Communication: daughter  Consults called: James Owen  Admission status: inpatient    James Odea MD [facility] Pager: [email or web address] Password [email or web address] 7PM-7AM, please contact night-coverage   11/10/2018, 4:29 PM

## 2018-11-10 NOTE — ED Notes (Signed)
I was asked by staff to come and speak with family regarding being pulled to Progression Bed. I explained the process and patient stated that he if was pulled to hallway than he would leave or need to be transferred to [facility] for upsetting the patient and again reiterated the progressive bed process and if they had not been used earlier prior to their arrival then there would have been no where for them to receive the rapid assessment that they did-

## 2018-11-11 ENCOUNTER — Encounter (HOSPITAL_COMMUNITY): Payer: Self-pay | Admitting: *Deleted

## 2018-11-11 ENCOUNTER — Encounter (HOSPITAL_COMMUNITY)
Admission: EM | Disposition: A | Discharge status: Home / Self Care | Payer: Self-pay | Source: Home / Self Care | Attending: Family Medicine

## 2018-11-11 ENCOUNTER — Inpatient Hospital Stay (HOSPITAL_COMMUNITY): Payer: Medicare Other | Admitting: Anesthesiology

## 2018-11-11 ENCOUNTER — Inpatient Hospital Stay (HOSPITAL_COMMUNITY): Payer: Medicare Other

## 2018-11-11 DIAGNOSIS — K8309 Other cholangitis: Secondary | ICD-10-CM

## 2018-11-11 DIAGNOSIS — R945 Abnormal results of liver function studies: Secondary | ICD-10-CM

## 2018-11-11 DIAGNOSIS — K8031 Calculus of bile duct with cholangitis, unspecified, with obstruction: Secondary | ICD-10-CM

## 2018-11-11 DIAGNOSIS — K3189 Other diseases of stomach and duodenum: Secondary | ICD-10-CM

## 2018-11-11 HISTORY — PX: ERCP: SHX5425

## 2018-11-11 HISTORY — PX: REMOVAL OF STONES: SHX5545

## 2018-11-11 HISTORY — PX: SPHINCTEROTOMY: SHX5544

## 2018-11-11 HISTORY — PX: BIOPSY: SHX5522

## 2018-11-11 LAB — COMPREHENSIVE METABOLIC PANEL
ALT: 125 U/L — ABNORMAL HIGH (ref 0–44)
AST: 96 U/L — ABNORMAL HIGH (ref 15–41)
Albumin: 2.7 g/dL — ABNORMAL LOW (ref 3.5–5.0)
Alkaline Phosphatase: 106 U/L (ref 38–126)
Anion gap: 10 (ref 5–15)
BUN: 14 mg/dL (ref 8–23)
CO2: 24 mmol/L (ref 22–32)
CREATININE: 1.56 mg/dL — AB (ref 0.61–1.24)
Calcium: 8.6 mg/dL — ABNORMAL LOW (ref 8.9–10.3)
Chloride: 105 mmol/L (ref 98–111)
GFR calc Af Amer: 50 mL/min — ABNORMAL LOW (ref >60)
GFR calc non Af Amer: 43 mL/min — ABNORMAL LOW (ref >60)
Glucose, Bld: 104 mg/dL — ABNORMAL HIGH (ref 70–99)
Potassium: 4.5 mmol/L (ref 3.5–5.1)
Sodium: 139 mmol/L (ref 135–145)
Total Bilirubin: 5.9 mg/dL — ABNORMAL HIGH (ref 0.3–1.2)
Total Protein: 6 g/dL — ABNORMAL LOW (ref 6.5–8.1)

## 2018-11-11 LAB — CBC
HCT: 41.9 % (ref 39.0–52.0)
Hemoglobin: 13.3 g/dL (ref 13.0–17.0)
MCH: 29.6 pg (ref 26.0–34.0)
MCHC: 31.7 g/dL (ref 30.0–36.0)
MCV: 93.3 fL (ref 80.0–100.0)
Platelets: 254 10*3/uL (ref 150–400)
RBC: 4.49 MIL/uL (ref 4.22–5.81)
RDW: 13.2 % (ref 11.5–15.5)
WBC: 13.8 10*3/uL — ABNORMAL HIGH (ref 4.0–10.5)
nRBC: 0 % (ref 0.0–0.2)

## 2018-11-11 LAB — BILIRUBIN, DIRECT: Bilirubin, Direct: 3.5 mg/dL — ABNORMAL HIGH (ref 0.0–0.2)

## 2018-11-11 LAB — C-REACTIVE PROTEIN: CRP: 9 mg/dL — ABNORMAL HIGH (ref <1.0)

## 2018-11-11 LAB — LIPASE, BLOOD: LIPASE: 791 U/L — AB (ref 11–51)

## 2018-11-11 SURGERY — ERCP, WITH INTERVENTION IF INDICATED
Anesthesia: General

## 2018-11-11 MED ORDER — SUGAMMADEX SODIUM 500 MG/5ML IV SOLN
INTRAVENOUS | Status: DC | PRN
  Administered 2018-11-11: 300 mg via INTRAVENOUS

## 2018-11-11 MED ORDER — LACTATED RINGERS IV SOLN
INTRAVENOUS | Status: DC
  Administered 2018-11-11 – 2018-11-12 (×3): via INTRAVENOUS

## 2018-11-11 MED ORDER — DEXAMETHASONE SODIUM PHOSPHATE 10 MG/ML IJ SOLN
INTRAMUSCULAR | Status: DC | PRN
  Administered 2018-11-11: 5 mg via INTRAVENOUS

## 2018-11-11 MED ORDER — FENTANYL CITRATE (PF) 100 MCG/2ML IJ SOLN
INTRAMUSCULAR | Status: DC | PRN
  Administered 2018-11-11: 100 ug via INTRAVENOUS

## 2018-11-11 MED ORDER — INDOMETHACIN 50 MG RE SUPP
RECTAL | Status: DC | PRN
  Administered 2018-11-11: 100 mg via RECTAL

## 2018-11-11 MED ORDER — LIDOCAINE 2% (20 MG/ML) 5 ML SYRINGE
INTRAMUSCULAR | Status: DC | PRN
  Administered 2018-11-11: 60 mg via INTRAVENOUS

## 2018-11-11 MED ORDER — ACETAMINOPHEN 500 MG PO TABS
1000.0000 mg | ORAL_TABLET | Freq: Once | ORAL | Status: AC
  Administered 2018-11-11: 1000 mg via ORAL

## 2018-11-11 MED ORDER — LEVOTHYROXINE SODIUM 50 MCG PO TABS
50.0000 ug | ORAL_TABLET | Freq: Every day | ORAL | Status: DC
  Administered 2018-11-12: 50 ug via ORAL
  Filled 2018-11-11: qty 1

## 2018-11-11 MED ORDER — PROPOFOL 10 MG/ML IV BOLUS
INTRAVENOUS | Status: DC | PRN
  Administered 2018-11-11: 110 mg via INTRAVENOUS

## 2018-11-11 MED ORDER — PHENOL 1.4 % MT LIQD
1.0000 | OROMUCOSAL | Status: DC | PRN
  Administered 2018-11-11: 1 via OROMUCOSAL
  Filled 2018-11-11: qty 177

## 2018-11-11 MED ORDER — INDOMETHACIN 50 MG RE SUPP
RECTAL | Status: AC
  Filled 2018-11-11: qty 2

## 2018-11-11 MED ORDER — SODIUM CHLORIDE 0.9 % IV SOLN: INTRAVENOUS | Status: DC

## 2018-11-11 MED ORDER — ROCURONIUM BROMIDE 10 MG/ML (PF) SYRINGE
PREFILLED_SYRINGE | INTRAVENOUS | Status: DC | PRN
  Administered 2018-11-11: 50 mg via INTRAVENOUS

## 2018-11-11 MED ORDER — GLUCAGON HCL RDNA (DIAGNOSTIC) 1 MG IJ SOLR
INTRAMUSCULAR | Status: AC
  Filled 2018-11-11: qty 1

## 2018-11-11 MED ORDER — ACETAMINOPHEN 500 MG PO TABS
ORAL_TABLET | ORAL | Status: AC
  Filled 2018-11-11: qty 2

## 2018-11-11 MED ORDER — MIDAZOLAM HCL 5 MG/5ML IJ SOLN
INTRAMUSCULAR | Status: DC | PRN
  Administered 2018-11-11: 1 mg via INTRAVENOUS

## 2018-11-11 MED ORDER — IOPAMIDOL (ISOVUE-300) INJECTION 61%
INTRAVENOUS | Status: AC
  Filled 2018-11-11: qty 100

## 2018-11-11 NOTE — Anesthesia Procedure Notes (Signed)
Procedure Name: Intubation Date/Time: 11/11/2018 12:35 PM Performed by: Moshe Salisbury, CRNA Pre-anesthesia Checklist: Patient identified, Emergency Drugs available, Suction available and Patient being monitored Patient Re-evaluated:Patient Re-evaluated prior to induction Oxygen Delivery Method: Circle System Utilized Preoxygenation: Pre-oxygenation with 100% oxygen Induction Type: IV induction Ventilation: Mask ventilation without difficulty Laryngoscope Size: Mac and 4 Grade View: Grade I Tube type: Oral Tube size: 7.5 mm Number of attempts: 1 Airway Equipment and Method: Stylet Placement Confirmation: ETT inserted through vocal cords under direct vision,  positive ETCO2 and breath sounds checked- equal and bilateral Secured at: 22 cm Tube secured with: Tape Dental Injury: Teeth and Oropharynx as per pre-operative assessment

## 2018-11-11 NOTE — Op Note (Signed)
[facility] Patient Name: James Owen Procedure Date : 11/11/2018 MRN: [id number] Attending MD: Justice Britain , MD Date of Birth: [birth year] CSN: [id number] Age: 73 Admit Type: Inpatient Procedure:                ERCP Indications:              Bile duct stone(s), Abnormal MRCP, Suspected                            ascending cholangitis, For therapy of ascending                            cholangitis, Jaundice, Abnormal liver function                            test, Gallstone associated acute pancreatitis Providers:                Justice Britain, MD, Carlyn Reichert, RN, Elspeth Cho Tech., Technician, Claybon Jabs CRNA, CRNA Referring MD:             Sharon Mt. White MD, MD, Merrilee Seashore,                            MD, Dr. Marcelline Deist ([facility]) Medicines:                Monitored Anesthesia Care, General Anesthesia,                            Zosyn, Indomethacin 440 mg PR Complications:            No immediate complications. Estimated Blood Loss:     Estimated blood loss was minimal. Procedure:                Pre-Anesthesia Assessment:                           - Prior to the procedure, a History and Physical                            was performed, and patient medications and                            allergies were reviewed. The patient's tolerance of                            previous anesthesia was also reviewed. The risks                            and benefits of the procedure and the sedation                            options and risks were discussed with the patient.  All questions were answered, and informed consent                            was obtained. Prior Anticoagulants: The patient has                            taken no previous anticoagulant or antiplatelet                            agents. ASA Grade Assessment: III - A patient with                            severe systemic  disease. After reviewing the risks                            and benefits, the patient was deemed in                            satisfactory condition to undergo the procedure.                           After obtaining informed consent, the scope was                            passed under direct vision. Throughout the                            procedure, the patient's blood pressure, pulse, and                            oxygen saturations were monitored continuously. The                            TJF-Q180V ([id number]) Olympus duodenoscope was                            introduced through the mouth, and used to inject                            contrast into and used to inject contrast into the                            bile duct. The ERCP was accomplished without                            difficulty. The patient tolerated the procedure. Scope In: Scope Out: Findings:      A scout film of the abdomen was obtained. Surgical clips, consistent       with a previous cholecystectomy, were seen in the area of the right       upper quadrant of the abdomen.      The upper GI tract was traversed under direct vision without detailed       examination. Localized moderately erythematous mucosa without bleeding  was found in the gastric body. Biopsies were taken on the greater       curvature of the gastric body, on the lesser curvature of the gastric       body, at the incisura and in the gastric antrum through the ERCP scope       with the cold forceps for histology and H. pylori evaluation. The region       near the presumed minor papilla was prominent and slightly bulging but       no adenomatous appearing tissue was found in the region. The major       papilla was on the rim of a diverticulum. The major papilla was       otherwise normal.      A short 0.035 inch Soft Jagwire was passed into the biliary tree. The       Autotome sphincterotome was passed over the guidewire and the bile  duct       was then deeply cannulated. Contrast was injected. I personally       interpreted the bile duct images. Ductal flow of contrast was adequate.       Image quality was adequate. Contrast extended to the hepatic ducts.       Opacification of the entire biliary tree except for the cystic duct and       gallbladder was successful. The lower third of the main duct contained       filling defects thought to be a larger stone as well as biliary sludge.       The main bile duct was moderately dilated, with aforementioned stone       causing an obstruction. The largest diameter was 14 mm. An 8 mm biliary       sphincterotomy was made with a monofilament Autotome sphincterotome       using ERBE electrocautery. There was self limited oozing from the       sphincterotomy which did not require treatment. The biliary tree was       swept with a retrieval balloon starting at the bifurcation. Pus was       swept from the duct. Sludge was swept from the duct. One stone was       removed. No stones remained. An occlusion cholangiogram was performed       that showed no further significant biliary pathology.      A pancreatogram was not performed.      The duodenoscope was withdrawn from the patient. Impression:               - Erythematous mucosa in the gastric body. Biopsies                            were taken for HP evaluation.                           - The region of the presumed minor papilla appeared                            to be prominent and bulging but no adenomatous                            appearing tissue was present - query a result of  pancreatitis if it is the minor.                           - The major papilla was on the rim of a                            diverticulum but otherwise was normal.                           - Filling defects consistent with a stone and                            sludge were seen on the cholangiogram.                            - The entire main bile duct was moderately dilated,                            with stone causing an obstruction.                           - Cholangitis & Choledocholithiasis was found.                            Complete removal was accomplished by biliary                            sphincterotomy and balloon sweeping/trawl. Recommendation:           - The patient will be observed post-procedure,                            until all discharge criteria are met.                           - Return patient to hospital ward for ongoing care.                           - Continue Antibiotics for at least 5-days total to                            decrease risk of post-ERCP infectious complications                            as well as conern for underlying cholangitis                            (though stone and obstruction now relieved). After                            48 hours of broad spectrum antibiotics, if patient                            is doing well it would be reasonable to consider  Ciprofloxacin 500 mg BID to complete 5-days unless                            evidence of bacteremia is found.                           - Check liver enzymes (AST, ALT, alkaline                            phosphatase, bilirubin) in the morning.                           - Observe patient's clinical course.                           - Watch for pancreatitis, bleeding, perforation,                            and cholangitis.                           - Await path results and treat HP if positive.                           - Will want to see patient back in future in clinic                            to discuss possible EUS/EGD to evaluate the                            prominent region near the minor papilla, which may                            be a result of recent pancreatitis but ensure no                            other lesion is noted such as a submucosal lesion                             though MRI is unrevealing in this respects.                           - The findings and recommendations were discussed                            with the referring physician.                           - The findings and recommendations were discussed                            with the patient.                           - The  findings and recommendations were discussed                            with the patient's family. Procedure Code(s):        --- Professional ---                           [phone], Endoscopic retrograde                            cholangiopancreatography (ERCP); with removal of                            calculi/debris from biliary/pancreatic duct(s)                           43262, Endoscopic retrograde                            cholangiopancreatography (ERCP); with                            sphincterotomy/papillotomy                           43261, Endoscopic retrograde                            cholangiopancreatography (ERCP); with biopsy,                            single or multiple Diagnosis Code(s):        --- Professional ---                           K31.89, Other diseases of stomach and duodenum                           K80.31, Calculus of bile duct with cholangitis,                            unspecified, with obstruction                           K80.30, Calculus of bile duct with cholangitis,                            unspecified, without obstruction                           R93.2, Abnormal findings on diagnostic imaging of                            liver and biliary tract                           K83.09, Other cholangitis  R17, Unspecified jaundice                           R94.5, Abnormal results of liver function studies                           K85.10, Biliary acute pancreatitis without necrosis                            or infection CPT copyright 2018 [employer]. All  rights reserved. The codes documented in this report are preliminary and upon coder review may  be revised to meet current compliance requirements. Justice Britain, MD 11/11/2018 1:33:59 PM Number of Addenda: 0

## 2018-11-11 NOTE — Anesthesia Postprocedure Evaluation (Signed)
Anesthesia Post Note  Patient: James Owen  Procedure(s) Performed: ENDOSCOPIC RETROGRADE CHOLANGIOPANCREATOGRAPHY (ERCP) (N/A ) SPHINCTEROTOMY BIOPSY REMOVAL OF STONES     Patient location during evaluation: Endoscopy Anesthesia Type: General Level of consciousness: awake and alert Pain management: pain level controlled Vital Signs Assessment: post-procedure vital signs reviewed and stable Respiratory status: spontaneous breathing, nonlabored ventilation, respiratory function stable and patient connected to nasal cannula oxygen Cardiovascular status: blood pressure returned to baseline and stable Postop Assessment: no apparent nausea or vomiting Anesthetic complications: no    Last Vitals:  Vitals:   11/11/18 1350 11/11/18 1647  BP: 111/72 111/76  Pulse: (!) 57 (!) 51  Resp: 16 19  Temp:    SpO2: 98% 98%    Last Pain:  Vitals:   11/11/18 1350  TempSrc:   PainSc: 0-No pain                 Arijana Narayan L Geanine Vandekamp

## 2018-11-11 NOTE — Anesthesia Preprocedure Evaluation (Addendum)
Anesthesia Evaluation  Patient identified by MRN, date of birth, ID band Patient awake    Reviewed: Allergy & Precautions, NPO status , Patient's Chart, lab work & pertinent test results  Airway Mallampati: III  TM Distance: >3 FB Neck ROM: Limited  Mouth opening: Limited Mouth Opening  Dental  (+) Upper Dentures, Lower Dentures, Dental Advisory Given   Pulmonary neg pulmonary ROS,    breath sounds clear to auscultation       Cardiovascular + dysrhythmias (s/p loop recorder) Atrial Fibrillation  Rhythm:Regular Rate:Normal  TTE 09/2017 - Left ventricle: The cavity size was normal. Wall thickness was increased in a pattern of moderate LVH. Systolic function was normal. The estimated ejection fraction was in the range of 60% to 65%. Although no diagnostic regional wall motion abnormality was identified, this possibility cannot be completely excluded on the basis of this study. - Mitral valve: Mildly calcified annulus. - Left atrium: The atrium was moderately to severely dilated. - Right atrium: The atrium was moderately to severely dilated. - Pericardium, extracardiac: A trivial pericardial effusion was identified.   Neuro/Psych negative neurological ROS  negative psych ROS   GI/Hepatic negative GI ROS, Neg liver ROS,   Endo/Other  Hypothyroidism   Renal/GU Renal InsufficiencyRenal disease  negative genitourinary   Musculoskeletal negative musculoskeletal ROS (+)   Abdominal   Peds  Hematology negative hematology ROS (+)   Anesthesia Other Findings   Reproductive/Obstetrics negative OB ROS                           Anesthesia Physical Anesthesia Plan  ASA: III  Anesthesia Plan: General   Post-op Pain Management:    Induction: Intravenous  PONV Risk Score and Plan: 2 and Ondansetron, Dexamethasone and Treatment may vary due to age or medical condition  Airway Management Planned: Oral  ETT  Additional Equipment:   Intra-op Plan:   Post-operative Plan: Extubation in OR  Informed Consent: I have reviewed the patients History and Physical, chart, labs and discussed the procedure including the risks, benefits and alternatives for the proposed anesthesia with the patient or authorized representative who has indicated his/her understanding and acceptance.     Dental advisory given  Plan Discussed with: CRNA, Anesthesiologist and Surgeon  Anesthesia Plan Comments:        Anesthesia Quick Evaluation

## 2018-11-11 NOTE — Progress Notes (Signed)
Patient ID: James Owen, male   DOB: 05/10/1946, 72 y.o.   MRN: [id number] BRIEF GI UPDATE:Abdominal MRI/MRCP 11/10/2018 identified choledocholithiasis, with stone at the distal common bile duct above the ampulla. Estimated diameter 7 mm. There is associated mild extrahepatic biliary ductal dilatation. Mild inflammatory changes at the pancreatic head most likely representing early pancreatitis.  AM labs: WBC 13.8. T.  Bili 5.9. Alk phos 106. AST 96. AL 125.   Central upper abdominal pain has decreased, no further vomiting or rigors. He is afebrile. BP stable.   ERCP scheduled at 1pm today with Dr. Mansouraty. Benefits and risks of ERCP discussed with patient, wife and daughter. Consent signed.  

## 2018-11-11 NOTE — Transfer of Care (Signed)
Immediate Anesthesia Transfer of Care Note  Patient: James Owen  Procedure(s) Performed: ENDOSCOPIC RETROGRADE CHOLANGIOPANCREATOGRAPHY (ERCP) (N/A ) SPHINCTEROTOMY BIOPSY REMOVAL OF STONES  Patient Location: Endoscopy Unit  Anesthesia Type:General  Level of Consciousness: awake and patient cooperative  Airway & Oxygen Therapy: Patient Spontanous Breathing and Patient connected to nasal cannula oxygen  Post-op Assessment: Report given to RN, Post -op Vital signs reviewed and stable and Patient moving all extremities  Post vital signs: Reviewed and stable  Last Vitals:  Vitals Value Taken Time  BP 109/61 11/11/2018  1:30 PM  Temp 37.1 C 11/11/2018  1:30 PM  Pulse 59 11/11/2018  1:34 PM  Resp 17 11/11/2018  1:34 PM  SpO2 97 % 11/11/2018  1:34 PM  Vitals shown include unvalidated device data.  Last Pain:  Vitals:   11/11/18 1330  TempSrc: Oral  PainSc: 0-No pain      Patients Stated Pain Goal: 2 (92/42/[id number])  Complications: No apparent anesthesia complications

## 2018-11-11 NOTE — Progress Notes (Addendum)
PROGRESS NOTE    James Owen  LKT:[id number] DOB: July 12, 1946 DOA: 11/10/2018 PCP: Merrilee Seashore, MD   Brief Narrative: James Owen is a 72 y.o. male with medical history of hypothyroidism and CKD 3 who presents with abdominal pain for 2 wks which is worse now and is present when he eats. He has had a cholecystectomy last year and recently he has elevated LFTs. In ED today, he has persistently elevated LFTs and an elevated Lipase. He had an ultrasound and a CT scan of his abdomen as outpt and these did not reveal and etiology for his symptoms and abnormal lab findings.  Currently he is vomiting and shaking either due to pain or rigors. He is unable to give a history.   Assessment & Plan:   Principal Problem:   Elevated LFTs   Cholangitis  Choledocholithiasis  Gallstone Pancreatitis: MRCP with choledocholithiasis with stone at distal common bile duct. 7 mm diameter.  Mild extrahepatic biliary ductal dilatation. GI c/s, s/p ERCP with biliary sphincterotomy and balloon sweeping/trawl Continue zosyn.  Unfortunately, no blood cx obtained at presentation.  Afebrile since presentation.  CTM on zosyn.  He's already s/p cholecystectomy Needs outpatient f/u with GI to follow up prominent area near minor papilla   Elevated Liver Enzymes  Jaundice:   2/2 above, follow post ERCP  Erythematous Mucosa in gastric body:  Follow biopsies taken from ERCP for H Pylori - pending  Hypothyroidism Synthroid  CKD stage III Continue to monitor Baseline appears to be ~1.5, but has been better than that at times as well  DVT prophylaxis: SCDs Code Status: full  Family Communication: daughtes at bedside Disposition Plan: pending further GI evaluation, monitoring after ERCP, broad spectrum abx in setting of cholangitis   Consultants:   GI  Procedures:   Impression ERCP - Erythematous mucosa in the gastric body. Biopsies were taken for HP evaluation. - The region of  the presumed minor papilla appeared to be prominent and bulging but no adenomatous appearing tissue was present - query a result of pancreatitis if it is the minor. - The major papilla was on the rim of a diverticulum but otherwise was normal. - Filling defects consistent with a stone and sludge were seen on the cholangiogram. - The entire main bile duct was moderately dilated, with stone causing an obstruction. - Cholangitis & Choledocholithiasis was found. Complete removal was accomplished by biliary sphincterotomy and balloon sweeping/trawl.  Recommendations ERCP  The patient will be observed post-procedure, until all discharge criteria are met. - Return patient to hospital ward for ongoing care. - Continue Antibiotics for at least 5-days total to decrease risk of post-ERCP infectious complications as well as conern for underlying cholangitis (though stone and obstruction now relieved). After 48 hours of broad spectrum antibiotics, if patient is doing well it would be reasonable to consider Ciprofloxacin 500 mg BID to complete 5- days unless evidence of bacteremia is found. - Check liver enzymes (AST, ALT, alkaline phosphatase, bilirubin) in the morning. - Observe patient's clinical course. - Watch for pancreatitis, bleeding, perforation, and cholangitis. - Await path results and treat HP if positive. - Will want to see patient back in future in clinic to discuss possible EUS/EGD to evaluate the prominent region near the minor papilla, which may be a result of recent pancreatitis but ensure no other lesion is noted such as a submucosal lesion though MRI is unrevealing in this respects. - The findings and recommendations were discussed with the referring physician. - The findings  and recommendations were discussed with the patient. - The findings and recommendations were discussed with the patient's family.   Antimicrobials:  Anti-infectives (From admission, onward)   Start      Dose/Rate Route Frequency Ordered Stop   11/11/18 0100  piperacillin-tazobactam (ZOSYN) IVPB 3.375 g     3.375 g 12.5 mL/hr over 240 Minutes Intravenous Every 8 hours 11/10/18 1645     11/10/18 1700  piperacillin-tazobactam (ZOSYN) IVPB 3.375 g     3.375 g 100 mL/hr over 30 Minutes Intravenous  Once 11/10/18 1645 11/10/18 2038        Subjective: Pain improved Eating liquid diet.  Did well with soup. 2 daughters at bedside.   Objective: Vitals:   11/11/18 1330 11/11/18 1340 11/11/18 1350 11/11/18 1647  BP: 109/61 97/73 111/72 111/76  Pulse: 67 68 (!) 57 (!) 51  Resp: _0 Temp: 98.7 F (37.1 C)     TempSrc: Oral     SpO2: 96% 97% 98% 98%  Weight:      Height:        Intake/Output Summary (Last 24 hours) at 11/11/2018 1729 Last data filed at 11/11/2018 1327 Gross per 24 hour  Intake 800 ml  Output 100 ml  Net 700 ml   Filed Weights   11/11/18 0615  Weight: 87.5 kg    Examination:  General exam: Appears calm and comfortable  Respiratory system: Clear to auscultation. Respiratory effort normal. Cardiovascular system: S1 & S2 heard, RRR.  Gastrointestinal system: Abdomen is nondistended, soft and mild TTP in epigastric region.  Central nervous system: Alert and oriented. No focal neurological deficits. Extremities: no LEE Skin: No rashes, lesions or ulcers Psychiatry: Judgement and insight appear normal. Mood & affect appropriate.     Data Reviewed: I have personally reviewed following labs and imaging studies  CBC: Recent Labs  Lab 11/10/18 1240 11/11/18 0340  WBC 8.8 13.8*  HGB 14.3 13.3  HCT 45.8 41.9  MCV 95.2 93.3  PLT 267 939   Basic Metabolic Panel: Recent Labs  Lab 11/10/18 1240 11/11/18 0340  NA 136 139  K 3.9 4.5  CL 105 105  CO2 25 24  GLUCOSE 103* 104*  BUN 17 14  CREATININE 1.40* 1.56*  CALCIUM 8.9 8.6*   GFR: Estimated Creatinine Clearance: 46.9 mL/min (A) (by C-G formula based on SCr of 1.56 mg/dL (H)). Liver Function  Tests: Recent Labs  Lab 11/10/18 1240 11/10/18 2017 11/11/18 0340  AST 99* 98* 96*  ALT 149* 134* 125*  ALKPHOS 119 112 106  BILITOT 2.9* 5.6* 5.9*  PROT 6.7 6.2* 6.0*  ALBUMIN 3.3* 3.0* 2.7*   Recent Labs  Lab 11/10/18 1240 11/11/18 0340  LIPASE 340* 791*   No results for input(s): AMMONIA in the last 168 hours. Coagulation Profile: No results for input(s): INR, PROTIME in the last 168 hours. Cardiac Enzymes: No results for input(s): CKTOTAL, CKMB, CKMBINDEX, TROPONINI in the last 168 hours. BNP (last 3 results) No results for input(s): PROBNP in the last 8760 hours. HbA1C: No results for input(s): HGBA1C in the last 72 hours. CBG: No results for input(s): GLUCAP in the last 168 hours. Lipid Profile: No results for input(s): CHOL, HDL, LDLCALC, TRIG, CHOLHDL, LDLDIRECT in the last 72 hours. Thyroid Function Tests: No results for input(s): TSH, T4TOTAL, FREET4, T3FREE, THYROIDAB in the last 72 hours. Anemia Panel: No results for input(s): VITAMINB12, FOLATE, FERRITIN, TIBC, IRON, RETICCTPCT in the last 72 hours. Sepsis Labs: No results for input(s):  PROCALCITON, LATICACIDVEN in the last 168 hours.  No results found for this or any previous visit (from the past 240 hour(s)).       Radiology Studies: Mr 3d Recon At Scanner  Result Date: 11/10/2018 CLINICAL DATA:  73 year old male with gangrenous cholecystitis status post cholecystectomy and concern for choledocholithiasis EXAM: MRI ABDOMEN WITHOUT AND WITH CONTRAST (INCLUDING MRCP) TECHNIQUE: Multiplanar multisequence MR imaging of the abdomen was performed both before and after the administration of intravenous contrast. Heavily T2-weighted images of the biliary and pancreatic ducts were obtained, and three-dimensional MRCP images were rendered by post processing. CONTRAST:  9 cc Gadavist COMPARISON:  CT 09/18/2017 FINDINGS: Lower chest: Atelectasis. Hepatobiliary: Uniform signal of the patent parenchyma.  Cholecystectomy. Extrahepatic biliary ductal dilatation with evidence of stone measuring 7 mm at the distal common bile duct above the ampulla. Minimal intrahepatic biliary ductal dilatation. The common bile duct measures as large as 15 mm on the coronal sequences. Unremarkable signal of the portal and hepatic venous flow. Pancreas: Uniform signal throughout the pancreatic parenchyma. Mild inflammatory fluid adjacent to the pancreatic head at the pancreatic groove, and posterior to the second portion the duodenum. Spleen:  Unremarkable spleen Adrenals/Urinary Tract:  Unremarkable adrenal glands. No evidence of right-sided hydronephrosis. Cystic structure at the posterior aspect of the right kidney with contrast retention at the dependent aspects, compatible with dilated infundibulum. Similar appearance parapelvic cysts of the left renal hilum displacing the proximal ureter medially. No hydronephrosis. Bosniak 1 cysts on the lateral cortex of the left kidney. Stomach/Bowel: Unremarkable visualized stomach small bowel and colon. Vascular/Lymphatic:  Atherosclerotic changes without aneurysm. No adenopathy. Other:  None Musculoskeletal: Unremarkable signal of the visualized spine. IMPRESSION: Choledocholithiasis, with stone at the distal common bile duct above the ampulla. Estimated diameter 7 mm. There is associated mild extrahepatic biliary ductal dilatation. Recommend referral for GI evaluation and correlation with ERCP. Mild inflammatory changes at the pancreatic head. Recommend correlation with lab values if there is concern for early pancreatitis. Cholecystectomy. Electronically Signed   By: Corrie Mckusick D.O.   On: 11/10/2018 19:55   Dg Ercp Biliary & Pancreatic Ducts  Result Date: 11/11/2018 CLINICAL DATA:  ERCP for choledocholithiasis. EXAM: ERCP TECHNIQUE: Multiple spot images obtained with the fluoroscopic device and submitted for interpretation post-procedure. COMPARISON:  MRCP - 11/10/2018 FLUOROSCOPY  TIME:  2 minutes, 30 seconds FINDINGS: Six spot intraoperative fluoroscopic images of the right upper abdominal quadrant during ERCP are provided for review Initial image demonstrates probe overlying the right upper abdominal quadrant. Cholecystectomy clips overlies expected location gallbladder fossa. Subsequent images demonstrate selective cannulation and opacification of the common bile duct. There are ill-defined filling defects within the distal aspect of the CBD which could represent nonocclusive choledocholithiasis versus biliary sludge. Subsequent images demonstrate insufflation of a balloon within the central aspect of the CBD with subsequent presumed biliary sweeping and sphincterotomy. IMPRESSION: ERCP with biliary sweeping and presumed sphincterotomy as above. These images were submitted for radiologic interpretation only. Please see the procedural report for the amount of contrast and the fluoroscopy time utilized. Electronically Signed   By: Sandi Mariscal M.D.   On: 11/11/2018 14:04   Mr Abdomen Mrcp Moise Boring Contast  Result Date: 11/10/2018 CLINICAL DATA:  73 year old male with gangrenous cholecystitis status post cholecystectomy and concern for choledocholithiasis EXAM: MRI ABDOMEN WITHOUT AND WITH CONTRAST (INCLUDING MRCP) TECHNIQUE: Multiplanar multisequence MR imaging of the abdomen was performed both before and after the administration of intravenous contrast. Heavily T2-weighted images of the biliary and  pancreatic ducts were obtained, and three-dimensional MRCP images were rendered by post processing. CONTRAST:  9 cc Gadavist COMPARISON:  CT 09/18/2017 FINDINGS: Lower chest: Atelectasis. Hepatobiliary: Uniform signal of the patent parenchyma. Cholecystectomy. Extrahepatic biliary ductal dilatation with evidence of stone measuring 7 mm at the distal common bile duct above the ampulla. Minimal intrahepatic biliary ductal dilatation. The common bile duct measures as large as 15 mm on the coronal  sequences. Unremarkable signal of the portal and hepatic venous flow. Pancreas: Uniform signal throughout the pancreatic parenchyma. Mild inflammatory fluid adjacent to the pancreatic head at the pancreatic groove, and posterior to the second portion the duodenum. Spleen:  Unremarkable spleen Adrenals/Urinary Tract:  Unremarkable adrenal glands. No evidence of right-sided hydronephrosis. Cystic structure at the posterior aspect of the right kidney with contrast retention at the dependent aspects, compatible with dilated infundibulum. Similar appearance parapelvic cysts of the left renal hilum displacing the proximal ureter medially. No hydronephrosis. Bosniak 1 cysts on the lateral cortex of the left kidney. Stomach/Bowel: Unremarkable visualized stomach small bowel and colon. Vascular/Lymphatic:  Atherosclerotic changes without aneurysm. No adenopathy. Other:  None Musculoskeletal: Unremarkable signal of the visualized spine. IMPRESSION: Choledocholithiasis, with stone at the distal common bile duct above the ampulla. Estimated diameter 7 mm. There is associated mild extrahepatic biliary ductal dilatation. Recommend referral for GI evaluation and correlation with ERCP. Mild inflammatory changes at the pancreatic head. Recommend correlation with lab values if there is concern for early pancreatitis. Cholecystectomy. Electronically Signed   By: Corrie Mckusick D.O.   On: 11/10/2018 19:55        Scheduled Meds: . [START ON 11/12/2018] levothyroxine  50 mcg Oral Q0600  . pantoprazole  40 mg Oral Daily  . sodium chloride flush  3 mL Intravenous Once   Continuous Infusions: . lactated ringers 200 mL/hr at 11/11/18 1432  . piperacillin-tazobactam (ZOSYN)  IV 3.375 g (11/11/18 1055)     LOS: 1 day    Time spent: over 21 min    Fayrene Helper, MD [facility] Pager AMION  If 7PM-7AM, please contact night-coverage [email or web address] Password [facility] 11/11/2018, 5:29 PM

## 2018-11-11 NOTE — Interval H&P Note (Signed)
History and Physical Interval Note:  11/11/2018 12:03 PM  James Owen  has presented today for surgery, with the diagnosis of Choledocholithiasis & Pancreatitis  The various methods of treatment have been discussed with the patient and family. After consideration of risks, benefits and other options for treatment, the patient has consented to  Procedure(s): ENDOSCOPIC RETROGRADE CHOLANGIOPANCREATOGRAPHY (ERCP) (N/A) as a surgical intervention .  The patient's history has been reviewed, patient examined, no change in status, stable for surgery.  I have reviewed the patient's chart and labs.  Questions were answered to the patient's satisfaction.    The risks of an ERCP were discussed at length, including but not limited to the risk of perforation, bleeding, abdominal pain, post-ERCP pancreatitis (while usually mild can be severe and even life threatening).    [employer]

## 2018-11-12 DIAGNOSIS — Z9889 Other specified postprocedural states: Secondary | ICD-10-CM

## 2018-11-12 LAB — COMPREHENSIVE METABOLIC PANEL
ALT: 84 U/L — ABNORMAL HIGH (ref 0–44)
ANION GAP: 5 (ref 5–15)
AST: 48 U/L — ABNORMAL HIGH (ref 15–41)
Albumin: 2.4 g/dL — ABNORMAL LOW (ref 3.5–5.0)
Alkaline Phosphatase: 93 U/L (ref 38–126)
BUN: 18 mg/dL (ref 8–23)
CO2: 24 mmol/L (ref 22–32)
Calcium: 8.6 mg/dL — ABNORMAL LOW (ref 8.9–10.3)
Chloride: 109 mmol/L (ref 98–111)
Creatinine, Ser: 1.36 mg/dL — ABNORMAL HIGH (ref 0.61–1.24)
GFR calc Af Amer: 59 mL/min — ABNORMAL LOW (ref >60)
GFR calc non Af Amer: 51 mL/min — ABNORMAL LOW (ref >60)
GLUCOSE: 117 mg/dL — AB (ref 70–99)
Potassium: 4.8 mmol/L (ref 3.5–5.1)
Sodium: 138 mmol/L (ref 135–145)
Total Bilirubin: 2.2 mg/dL — ABNORMAL HIGH (ref 0.3–1.2)
Total Protein: 5.6 g/dL — ABNORMAL LOW (ref 6.5–8.1)

## 2018-11-12 LAB — CBC
HCT: 40.1 % (ref 39.0–52.0)
Hemoglobin: 12.7 g/dL — ABNORMAL LOW (ref 13.0–17.0)
MCH: 29.6 pg (ref 26.0–34.0)
MCHC: 31.7 g/dL (ref 30.0–36.0)
MCV: 93.5 fL (ref 80.0–100.0)
Platelets: 234 10*3/uL (ref 150–400)
RBC: 4.29 MIL/uL (ref 4.22–5.81)
RDW: 12.9 % (ref 11.5–15.5)
WBC: 9 10*3/uL (ref 4.0–10.5)
nRBC: 0 % (ref 0.0–0.2)

## 2018-11-12 LAB — MAGNESIUM: Magnesium: 2.1 mg/dL (ref 1.7–2.4)

## 2018-11-12 LAB — CALCIUM, IONIZED: Calcium, Ionized, Serum: 4.9 mg/dL (ref 4.5–5.6)

## 2018-11-12 LAB — LIPASE, BLOOD: Lipase: 765 U/L — ABNORMAL HIGH (ref 11–51)

## 2018-11-12 MED ORDER — CIPROFLOXACIN HCL 500 MG PO TABS: 500.0000 mg | ORAL_TABLET | Freq: Two times a day (BID) | ORAL | 0 refills | Status: AC

## 2018-11-12 NOTE — Discharge Summary (Signed)
Physician Discharge Summary  James Owen MWN:[id number] DOB: 1946-05-31 DOA: 11/10/2018  PCP: Merrilee Seashore, MD  Admit date: 11/10/2018 Discharge date: 11/12/2018  Time spent: 40 minutes  Recommendations for Outpatient Follow-up:  1. Follow up outpatient CBC/CMP 2. Follow up with GI as outpatient 3. Biopsy after discharge noted to have chronic mild inflammation of antral and oxyntic mucosa.  Negative for H pylori.  No metaplasia, dysplasia or malignancy. 4. Per GI, needs follow up in clinic in future to follow up possible EUS/EGD to evaluate prominent region near mionr papilla 5. Follow up repeat LFT's within 1 week 6. Tele with sinus brady and supraventricular tachycardia seen on day of discharge.  PT asx, discussed outpatient follow up.  Discharge Diagnoses:  Principal Problem:   Elevated LFTs   Discharge Condition: stable  Diet recommendation: heart healthy  Filed Weights   11/11/18 0615  Weight: 87.5 kg    History of present illness:  James Kerth Gerringeris a 73 y.o.malewith medical history ofhypothyroidismand CKD 3 who presents with abdominal pain for 2 wks which is worse now and is present when he eats. He has had a cholecystectomy last year and recently he has elevated LFTs. In ED today, he has persistently elevated LFTs and an elevated Lipase. He had an ultrasound and a CT scan of his abdomen as outpt and these did not reveal and etiology for his symptoms and abnormal lab findings.  Currently he is vomiting and shaking either due to pain or rigors. He is unable to give a history.  He was admitted for gallstone pancreatitis, cholangitis, and choledocholithiasis.  He underwent MRCP which showed choledocholithiasis with CBD stone.  He underwent ERCP with sphincterotomy and balloon sweeping/trawl on 2/26.  He's improved on 2/27 with improved LFT's, tolerating diet.  Discharged on cipro with outpatient follow up.  Hospital Course:  Cholangitis   Choledocholithiasis  Gallstone Pancreatitis: MRCP with choledocholithiasis with stone at distal common bile duct. 7 mm diameter.  Mild extrahepatic biliary ductal dilatation. GI c/s, s/p ERCP with biliary sphincterotomy and balloon sweeping/trawl Continue zosyn.  Unfortunately, no blood cx obtained at presentation.  Afebrile since presentation.  CTM on zosyn.  After discussion with GI, decided to d/c on cipro for 10 days since no blood cultures at presentation given rigors at presentation (pt with rigors at presentation, but afebrile and improving WBC at this time).  Discussed fq risks with pt/family.  He's been on this before and tolerated well. He's already s/p cholecystectomy Needs outpatient f/u with GI to follow up prominent area near minor papilla  Pt tolerating diet, eager for discharge today, minimal to no abdominal pain  Elevated Liver Enzymes  Jaundice:   LFT's improving  Erythematous Mucosa in gastric body:  Follow biopsies taken from ERCP for H Pylori - Stomach, biopsy, Gastric - ANTRAL AND OXYNTIC MUCOSA WITH MILD CHRONIC INFLAMMATION. - WARTHIN-STARRY STAIN NEGATIVE FOR HELICOBACTER PYLORI. - NO INTESTINAL METAPLASIA, DYSPLASIA, OR MALIGNANCY.  Hypothyroidism Synthroid  CKD stage III Continue to monitor Baseline appears to be ~1.5, but has been better than that at times as well  Supraventricular Tachy  Sinus Loletha Grayer: pt asymptomatic.  Has loop recorder and follows with Dr. Caryl Comes for hx of ?afib.   Discussed recommendation for outpatient follow up  Procedures: Impression ERCP - Erythematous mucosa in the gastric body. Biopsies were taken for HP evaluation. - The region of the presumed minor papilla appeared to be prominent and bulging but no adenomatous appearing tissue was present - query a result of  pancreatitis if it is the minor. - The major papilla was on the rim of a diverticulum but otherwise was normal. - Filling defects consistent with a stone and sludge were  seen on the cholangiogram. - The entire main bile duct was moderately dilated, with stone causing an obstruction. - Cholangitis & Choledocholithiasis was found. Complete removal was accomplished by biliary sphincterotomy and balloon sweeping/trawl.  Recommendations ERCP  The patient will be observed post-procedure, until all discharge criteria are met. - Return patient to hospital ward for ongoing care. - Continue Antibiotics for at least 5-days total to decrease risk of post-ERCP infectious complications as well as conern for underlying cholangitis (though stone and obstruction now relieved). After 48 hours of broad spectrum antibiotics, if patient is doing well it would be reasonable to consider Ciprofloxacin 500 mg BID to complete 5- days unless evidence of bacteremia is found. - Check liver enzymes (AST, ALT, alkaline phosphatase, bilirubin) in the morning. - Observe patient's clinical course. - Watch for pancreatitis, bleeding, perforation, and cholangitis. - Await path results and treat HP if positive. - Will want to see patient back in future in clinic to discuss possible EUS/EGD to evaluate the prominent region near the minor papilla, which may be a result of recent pancreatitis but ensure no other lesion is noted such as a submucosal lesion though MRI is unrevealing in this respects. - The findings and recommendations were discussed with the referring physician. - The findings and recommendations were discussed with the patient. - The findings and recommendations were discussed with the patient's family.  Consultations:  GO  Discharge Exam: Vitals:   11/12/18 0352 11/12/18 0953  BP: 113/79 126/82  Pulse: (!) 46 71  Resp: 20 18  Temp: 97.6 F (36.4 C) 97.9 F (36.6 C)  SpO2: 97% 99%   Feeling well.  Eager to discharge.  No complaints. Tolerating diet well. 2 daughters, sister at bedside  General: No acute distress. Scleral icterus improved Cardiovascular: Heart  sounds show a regular rate, and rhythm.  Lungs: Clear to auscultation bilaterally  Abdomen: Soft, nontender, nondistended  Neurological: Alert and oriented 3. Moves all extremities 4 Skin: Warm and dry. No rashes or lesions. Extremities: No clubbing or cyanosis. No edema.   Discharge Instructions   Discharge Instructions    Call MD for:  difficulty breathing, headache or visual disturbances   Complete by:  As directed    Call MD for:  extreme fatigue   Complete by:  As directed    Call MD for:  hives   Complete by:  As directed    Call MD for:  persistant dizziness or light-headedness   Complete by:  As directed    Call MD for:  persistant nausea and vomiting   Complete by:  As directed    Call MD for:  redness, tenderness, or signs of infection (pain, swelling, redness, odor or green/yellow discharge around incision site)   Complete by:  As directed    Call MD for:  severe uncontrolled pain   Complete by:  As directed    Call MD for:  temperature >100.4   Complete by:  As directed    Diet - low sodium heart healthy   Complete by:  As directed    Discharge instructions   Complete by:  As directed    You were seen for abdominal pain.  You had pancreatitis due to gallstones and cholangitis from an obstructing stone.  You had an ERCP with removal of the stone.  You'll need to follow up with GI regarding biopsies that were taken for H. Pylori and for reevaluation of a prominent area near the minor papilla.  Please follow up with gastroenterology.  We'll send you out on ciprofloxacin for 10 days.  Please return if you have any fevers or recurrent symptoms.  You had some episodes of slow and fast heart rates during your stay.  Please follow up with Dr. Caryl Comes as scheduled.   Return for new, recurrent, or worsening symptoms.  Please ask your PCP to request records from this hospitalization so they know what was done and what the next steps will be.   Increase activity slowly    Complete by:  As directed      Allergies as of 11/12/2018      Reactions   Vicodin [hydrocodone-acetaminophen] Other (See Comments)   Hallucinations      Medication List    STOP taking these medications   apixaban 5 MG Tabs tablet Commonly known as:  ELIQUIS     TAKE these medications   ciprofloxacin 500 MG tablet Commonly known as:  CIPRO Take 1 tablet (500 mg total) by mouth 2 (two) times daily for 10 days.   levothyroxine 50 MCG tablet Commonly known as:  SYNTHROID, LEVOTHROID Take 50 mcg by mouth daily before breakfast.   pantoprazole 40 MG tablet Commonly known as:  PROTONIX Take 40 mg by mouth daily.   REFRESH OP Place 1 drop into the left eye daily.      Allergies  Allergen Reactions  . Vicodin [Hydrocodone-Acetaminophen] Other (See Comments)    Hallucinations   Follow-up Information    Merrilee Seashore, MD Follow up.   Specialty:  Internal Medicine Contact information: [address] [place] [address] [phone]        Deboraha Sprang, MD .   Specialty:  Cardiology Contact information: [phone] [address] [place] [address] [phone]        Mansouraty, Telford Nab., MD Follow up.   Specialties:  Gastroenterology, Internal Medicine Contact information: [place] [address] [phone]            The results of significant diagnostics from this hospitalization (including imaging, microbiology, ancillary and laboratory) are listed below for reference.    Significant Diagnostic Studies: Ct Abdomen Pelvis W Contrast  Result Date: 11/05/2018 CLINICAL DATA:  Mid abdominal pain, nausea, bloating, weight loss EXAM: CT ABDOMEN AND PELVIS WITH CONTRAST TECHNIQUE: Multidetector CT imaging of the abdomen and pelvis was performed using the standard protocol following bolus administration of intravenous contrast. CONTRAST:  196m ISOVUE-300 IOPAMIDOL (ISOVUE-300) INJECTION 61% COMPARISON:   None. FINDINGS: Lower chest: Bibasilar scarring or atelectasis. Hepatobiliary: No focal liver abnormality is seen. Status post cholecystectomy. No biliary dilatation. Pancreas: Unremarkable. No pancreatic ductal dilatation or surrounding inflammatory changes. Spleen: Normal in size without focal abnormality. Adrenals/Urinary Tract: Adrenal glands are unremarkable. Large left parapelvic cyst. Kidneys are otherwise unremarkable without renal calculi, focal lesion, or hydronephrosis. Bladder is unremarkable. Stomach/Bowel: Stomach is within normal limits. Appendix appears normal. No evidence of bowel wall thickening, distention, or inflammatory changes. Sigmoid diverticulosis without evidence of acute diverticulitis. Vascular/Lymphatic: No significant vascular findings are present. No enlarged abdominal or pelvic lymph nodes. Reproductive: No mass or other abnormality. Other: No abdominal wall hernia or abnormality. No abdominopelvic ascites. Musculoskeletal: No acute or significant osseous findings. IMPRESSION: 1. No CT findings of the abdomen or pelvis to explain pain, nausea, bloating, or weight loss.  2.  Chronic and incidental findings as detailed above. Electronically Signed   By: Eddie Candle M.D.   On: 11/05/2018 11:07   Mr 3d Recon At Scanner  Result Date: 11/10/2018 CLINICAL DATA:  73 year old male with gangrenous cholecystitis status post cholecystectomy and concern for choledocholithiasis EXAM: MRI ABDOMEN WITHOUT AND WITH CONTRAST (INCLUDING MRCP) TECHNIQUE: Multiplanar multisequence MR imaging of the abdomen was performed both before and after the administration of intravenous contrast. Heavily T2-weighted images of the biliary and pancreatic ducts were obtained, and three-dimensional MRCP images were rendered by post processing. CONTRAST:  9 cc Gadavist COMPARISON:  CT 09/18/2017 FINDINGS: Lower chest: Atelectasis. Hepatobiliary: Uniform signal of the patent parenchyma. Cholecystectomy. Extrahepatic  biliary ductal dilatation with evidence of stone measuring 7 mm at the distal common bile duct above the ampulla. Minimal intrahepatic biliary ductal dilatation. The common bile duct measures as large as 15 mm on the coronal sequences. Unremarkable signal of the portal and hepatic venous flow. Pancreas: Uniform signal throughout the pancreatic parenchyma. Mild inflammatory fluid adjacent to the pancreatic head at the pancreatic groove, and posterior to the second portion the duodenum. Spleen:  Unremarkable spleen Adrenals/Urinary Tract:  Unremarkable adrenal glands. No evidence of right-sided hydronephrosis. Cystic structure at the posterior aspect of the right kidney with contrast retention at the dependent aspects, compatible with dilated infundibulum. Similar appearance parapelvic cysts of the left renal hilum displacing the proximal ureter medially. No hydronephrosis. Bosniak 1 cysts on the lateral cortex of the left kidney. Stomach/Bowel: Unremarkable visualized stomach small bowel and colon. Vascular/Lymphatic:  Atherosclerotic changes without aneurysm. No adenopathy. Other:  None Musculoskeletal: Unremarkable signal of the visualized spine. IMPRESSION: Choledocholithiasis, with stone at the distal common bile duct above the ampulla. Estimated diameter 7 mm. There is associated mild extrahepatic biliary ductal dilatation. Recommend referral for GI evaluation and correlation with ERCP. Mild inflammatory changes at the pancreatic head. Recommend correlation with lab values if there is concern for early pancreatitis. Cholecystectomy. Electronically Signed   By: Corrie Mckusick D.O.   On: 11/10/2018 19:55   Dg Ercp Biliary & Pancreatic Ducts  Result Date: 11/11/2018 CLINICAL DATA:  ERCP for choledocholithiasis. EXAM: ERCP TECHNIQUE: Multiple spot images obtained with the fluoroscopic device and submitted for interpretation post-procedure. COMPARISON:  MRCP - 11/10/2018 FLUOROSCOPY TIME:  2 minutes, 30 seconds  FINDINGS: Six spot intraoperative fluoroscopic images of the right upper abdominal quadrant during ERCP are provided for review Initial image demonstrates probe overlying the right upper abdominal quadrant. Cholecystectomy clips overlies expected location gallbladder fossa. Subsequent images demonstrate selective cannulation and opacification of the common bile duct. There are ill-defined filling defects within the distal aspect of the CBD which could represent nonocclusive choledocholithiasis versus biliary sludge. Subsequent images demonstrate insufflation of a balloon within the central aspect of the CBD with subsequent presumed biliary sweeping and sphincterotomy. IMPRESSION: ERCP with biliary sweeping and presumed sphincterotomy as above. These images were submitted for radiologic interpretation only. Please see the procedural report for the amount of contrast and the fluoroscopy time utilized. Electronically Signed   By: Sandi Mariscal M.D.   On: 11/11/2018 14:04   Mr Abdomen Mrcp Moise Boring Contast  Result Date: 11/10/2018 CLINICAL DATA:  73 year old male with gangrenous cholecystitis status post cholecystectomy and concern for choledocholithiasis EXAM: MRI ABDOMEN WITHOUT AND WITH CONTRAST (INCLUDING MRCP) TECHNIQUE: Multiplanar multisequence MR imaging of the abdomen was performed both before and after the administration of intravenous contrast. Heavily T2-weighted images of the biliary and pancreatic ducts were obtained, and  three-dimensional MRCP images were rendered by post processing. CONTRAST:  9 cc Gadavist COMPARISON:  CT 09/18/2017 FINDINGS: Lower chest: Atelectasis. Hepatobiliary: Uniform signal of the patent parenchyma. Cholecystectomy. Extrahepatic biliary ductal dilatation with evidence of stone measuring 7 mm at the distal common bile duct above the ampulla. Minimal intrahepatic biliary ductal dilatation. The common bile duct measures as large as 15 mm on the coronal sequences. Unremarkable signal  of the portal and hepatic venous flow. Pancreas: Uniform signal throughout the pancreatic parenchyma. Mild inflammatory fluid adjacent to the pancreatic head at the pancreatic groove, and posterior to the second portion the duodenum. Spleen:  Unremarkable spleen Adrenals/Urinary Tract:  Unremarkable adrenal glands. No evidence of right-sided hydronephrosis. Cystic structure at the posterior aspect of the right kidney with contrast retention at the dependent aspects, compatible with dilated infundibulum. Similar appearance parapelvic cysts of the left renal hilum displacing the proximal ureter medially. No hydronephrosis. Bosniak 1 cysts on the lateral cortex of the left kidney. Stomach/Bowel: Unremarkable visualized stomach small bowel and colon. Vascular/Lymphatic:  Atherosclerotic changes without aneurysm. No adenopathy. Other:  None Musculoskeletal: Unremarkable signal of the visualized spine. IMPRESSION: Choledocholithiasis, with stone at the distal common bile duct above the ampulla. Estimated diameter 7 mm. There is associated mild extrahepatic biliary ductal dilatation. Recommend referral for GI evaluation and correlation with ERCP. Mild inflammatory changes at the pancreatic head. Recommend correlation with lab values if there is concern for early pancreatitis. Cholecystectomy. Electronically Signed   By: Corrie Mckusick D.O.   On: 11/10/2018 19:55    Microbiology: No results found for this or any previous visit (from the past 240 hour(s)).   Labs: Basic Metabolic Panel: Recent Labs  Lab 11/10/18 1240 11/11/18 0340 11/12/18 0413  NA 136 139 138  K 3.9 4.5 4.8  CL 105 105 109  CO2 '25 24 24  ' GLUCOSE 103* 104* 117*  BUN '17 14 18  ' CREATININE 1.40* 1.56* 1.36*  CALCIUM 8.9 8.6* 8.6*  MG  --   --  2.1   Liver Function Tests: Recent Labs  Lab 11/10/18 1240 11/10/18 2017 11/11/18 0340 11/12/18 0413  AST 99* 98* 96* 48*  ALT 149* 134* 125* 84*  ALKPHOS 119 112 106 93  BILITOT 2.9* 5.6*  5.9* 2.2*  PROT 6.7 6.2* 6.0* 5.6*  ALBUMIN 3.3* 3.0* 2.7* 2.4*   Recent Labs  Lab 11/10/18 1240 11/11/18 0340  LIPASE 340* 791*   No results for input(s): AMMONIA in the last 168 hours. CBC: Recent Labs  Lab 11/10/18 1240 11/11/18 0340 11/12/18 0413  WBC 8.8 13.8* 9.0  HGB 14.3 13.3 12.7*  HCT 45.8 41.9 40.1  MCV 95.2 93.3 93.5  PLT 267 254 234   Cardiac Enzymes: No results for input(s): CKTOTAL, CKMB, CKMBINDEX, TROPONINI in the last 168 hours. BNP: BNP (last 3 results) No results for input(s): BNP in the last 8760 hours.  ProBNP (last 3 results) No results for input(s): PROBNP in the last 8760 hours.  CBG: No results for input(s): GLUCAP in the last 168 hours.     Signed:  Fayrene Helper MD.  [facility] 11/12/2018, 11:02 AM

## 2018-11-12 NOTE — Progress Notes (Signed)
[facility] Progress Note  CC: Jaundice, elevated LFTs, Choledocholithiasis   Subjective: Feeling much better today but didn't get much sleep during the night due to frequent interruptions. No nausea or vomiting. No abdominal pain. Eating solid foods without difficulty. Urinating yellow clear urine. Passed 1 dark brown formed stool last night. Family at bed side.    Objective:  Vital signs in last 24 hours: Temp:  [97.6 F (36.4 C)-99.1 F (37.3 C)] 97.6 F (36.4 C) (02/27 0352) Pulse Rate:  [46-68] 46 (02/27 0352) Resp:  [12-20] 20 (02/27 0352) BP: (97-126)/(61-79) 113/79 (02/27 0352) SpO2:  [96 %-98 %] 97 % (02/27 0352) Last BM Date: 11/11/18 General:   Alert,  Well-developed,    in NAD Eyes: sclera nonicteric today. Heart:  Regular rate and rhythm; no murmurs Pulm: clear throughout.  Abdomen:  Soft, nontender and nondistended. Normal bowel sounds, without guarding, and without rebound.   Extremities:  Without edema. Neurologic:  Alert and  oriented x4;  grossly normal neurologically. Psych:  Alert and cooperative. Normal mood and affect.  Intake/Output from previous day: 02/26 0701 - 02/27 0700 In: 2621.7 [P.O.:240; I.V.:2324.3; IV Piggyback:57.5] Out: -  Intake/Output this shift: No intake/output data recorded.  Lab Results: Recent Labs    11/10/18 1240 11/11/18 0340 11/12/18 0413  WBC 8.8 13.8* 9.0  HGB 14.3 13.3 12.7*  HCT 45.8 41.9 40.1  PLT 267 254 234   BMET Recent Labs    11/10/18 1240 11/11/18 0340 11/12/18 0413  NA 136 139 138  K 3.9 4.5 4.8  CL 105 105 109  CO2 _0 GLUCOSE 103* 104* 117*  BUN _1 CREATININE 1.40* 1.56* 1.36*  CALCIUM 8.9 8.6* 8.6*   LFT Recent Labs    11/10/18 2017 11/11/18 0340 11/12/18 0413  PROT 6.2* 6.0* 5.6*  ALBUMIN 3.0* 2.7* 2.4*  AST 98* 96* 48*  ALT 134* 125* 84*  ALKPHOS 112 106 93  BILITOT 5.6* 5.9* 2.2*  BILIDIR 3.6* 3.5*  --   IBILI 2.0*  --   --     Mr 3d Recon At  Scanner  Result Date: 11/10/2018 CLINICAL DATA:  73 year old male with gangrenous cholecystitis status post cholecystectomy and concern for choledocholithiasis EXAM: MRI ABDOMEN WITHOUT AND WITH CONTRAST (INCLUDING MRCP) TECHNIQUE: Multiplanar multisequence MR imaging of the abdomen was performed both before and after the administration of intravenous contrast. Heavily T2-weighted images of the biliary and pancreatic ducts were obtained, and three-dimensional MRCP images were rendered by post processing. CONTRAST:  9 cc Gadavist COMPARISON:  CT 09/18/2017 FINDINGS: Lower chest: Atelectasis. Hepatobiliary: Uniform signal of the patent parenchyma. Cholecystectomy. Extrahepatic biliary ductal dilatation with evidence of stone measuring 7 mm at the distal common bile duct above the ampulla. Minimal intrahepatic biliary ductal dilatation. The common bile duct measures as large as 15 mm on the coronal sequences. Unremarkable signal of the portal and hepatic venous flow. Pancreas: Uniform signal throughout the pancreatic parenchyma. Mild inflammatory fluid adjacent to the pancreatic head at the pancreatic groove, and posterior to the second portion the duodenum. Spleen:  Unremarkable spleen Adrenals/Urinary Tract:  Unremarkable adrenal glands. No evidence of right-sided hydronephrosis. Cystic structure at the posterior aspect of the right kidney with contrast retention at the dependent aspects, compatible with dilated infundibulum. Similar appearance parapelvic cysts of the left renal hilum displacing the proximal ureter medially. No hydronephrosis. Bosniak 1 cysts on the lateral cortex of the left kidney. Stomach/Bowel: Unremarkable visualized stomach small bowel and  colon. Vascular/Lymphatic:  Atherosclerotic changes without aneurysm. No adenopathy. Other:  None Musculoskeletal: Unremarkable signal of the visualized spine. IMPRESSION: Choledocholithiasis, with stone at the distal common bile duct above the ampulla.  Estimated diameter 7 mm. There is associated mild extrahepatic biliary ductal dilatation. Recommend referral for GI evaluation and correlation with ERCP. Mild inflammatory changes at the pancreatic head. Recommend correlation with lab values if there is concern for early pancreatitis. Cholecystectomy. Electronically Signed   By: Corrie Mckusick D.O.   On: 11/10/2018 19:55   Dg Ercp Biliary & Pancreatic Ducts  Result Date: 11/11/2018 CLINICAL DATA:  ERCP for choledocholithiasis. EXAM: ERCP TECHNIQUE: Multiple spot images obtained with the fluoroscopic device and submitted for interpretation post-procedure. COMPARISON:  MRCP - 11/10/2018 FLUOROSCOPY TIME:  2 minutes, 30 seconds FINDINGS: Six spot intraoperative fluoroscopic images of the right upper abdominal quadrant during ERCP are provided for review Initial image demonstrates probe overlying the right upper abdominal quadrant. Cholecystectomy clips overlies expected location gallbladder fossa. Subsequent images demonstrate selective cannulation and opacification of the common bile duct. There are ill-defined filling defects within the distal aspect of the CBD which could represent nonocclusive choledocholithiasis versus biliary sludge. Subsequent images demonstrate insufflation of a balloon within the central aspect of the CBD with subsequent presumed biliary sweeping and sphincterotomy. IMPRESSION: ERCP with biliary sweeping and presumed sphincterotomy as above. These images were submitted for radiologic interpretation only. Please see the procedural report for the amount of contrast and the fluoroscopy time utilized. Electronically Signed   By: Sandi Mariscal M.D.   On: 11/11/2018 14:04   Mr Abdomen Mrcp Moise Boring Contast  Result Date: 11/10/2018 CLINICAL DATA:  73 year old male with gangrenous cholecystitis status post cholecystectomy and concern for choledocholithiasis EXAM: MRI ABDOMEN WITHOUT AND WITH CONTRAST (INCLUDING MRCP) TECHNIQUE: Multiplanar  multisequence MR imaging of the abdomen was performed both before and after the administration of intravenous contrast. Heavily T2-weighted images of the biliary and pancreatic ducts were obtained, and three-dimensional MRCP images were rendered by post processing. CONTRAST:  9 cc Gadavist COMPARISON:  CT 09/18/2017 FINDINGS: Lower chest: Atelectasis. Hepatobiliary: Uniform signal of the patent parenchyma. Cholecystectomy. Extrahepatic biliary ductal dilatation with evidence of stone measuring 7 mm at the distal common bile duct above the ampulla. Minimal intrahepatic biliary ductal dilatation. The common bile duct measures as large as 15 mm on the coronal sequences. Unremarkable signal of the portal and hepatic venous flow. Pancreas: Uniform signal throughout the pancreatic parenchyma. Mild inflammatory fluid adjacent to the pancreatic head at the pancreatic groove, and posterior to the second portion the duodenum. Spleen:  Unremarkable spleen Adrenals/Urinary Tract:  Unremarkable adrenal glands. No evidence of right-sided hydronephrosis. Cystic structure at the posterior aspect of the right kidney with contrast retention at the dependent aspects, compatible with dilated infundibulum. Similar appearance parapelvic cysts of the left renal hilum displacing the proximal ureter medially. No hydronephrosis. Bosniak 1 cysts on the lateral cortex of the left kidney. Stomach/Bowel: Unremarkable visualized stomach small bowel and colon. Vascular/Lymphatic:  Atherosclerotic changes without aneurysm. No adenopathy. Other:  None Musculoskeletal: Unremarkable signal of the visualized spine. IMPRESSION: Choledocholithiasis, with stone at the distal common bile duct above the ampulla. Estimated diameter 7 mm. There is associated mild extrahepatic biliary ductal dilatation. Recommend referral for GI evaluation and correlation with ERCP. Mild inflammatory changes at the pancreatic head. Recommend correlation with lab values if  there is concern for early pancreatitis. Cholecystectomy. Electronically Signed   By: Corrie Mckusick D.O.   On: 11/10/2018 19:55  Assessment / Plan:  1. Choledocholithiasis/Cholangitis. S/ P ERCP with sphincterotomy and extraction of 1 large stone from the CBD 11/11/2018. An occlusion cholangiogram was performed that showed no further significant biliary pathology. Alk phos 93. AST 48 down from 96.  ALT 84 down from 125. T.bili 2.2 down from 5.9. WBC 9.0 down from 13.8.  Temp 100.6. BC ordered by hospitalist. Temp 97.6 this am. -continue IV antibiotics for now, to consider Cipro 535m bid to complete 5 days on antibiotics if patient remains stable 48 hours post ERCP -continue to monitor LFT -continue to monitor patient for bleeding, pain, infection  -reduce LR IV 50cc/hr -await gastric bx for H. Pylori  2. Early pancreatitis secondary to # 1. ERCP showed  prominent region near the minor papilla, which may be a result of recent pancreatitis. Lipase 791 11/13/2018. Will recheck level  today. -continue to monitor patient for abdominal pain -may need EGD/EUS as outpatient to further evaluate the pancreas, rule out submucosal pancreatic lesion -patient will require repeat LFTs 1 week post discharge and office visit with Dr. MRush Landmarkin 3 to 5 weeks  3. Elevated LFTs secondary to #1. Lfts drifting downward  Further recommendations per Dr. MRush Landmark will most likely sign off service later today     LOS: 2 days   CNoralyn Pick 11/12/2018, 9:30 AM

## 2018-11-12 NOTE — Progress Notes (Signed)
Patient discharged to home. After visit summary reviewed and patient capable of re verbalizing medications and follow up appointments. Pt remains stable. No signs and symptoms of distress. Mady Gemma, RN

## 2018-11-12 NOTE — Consult Note (Signed)
            [facility] Primary Care Navigator  11/12/2018  James Owen Harm 12-26-1945 [id number]   Went to seepatient at the bedside to identify possible discharge needs buthe wasalready discharged homeper RN report.  Per MD note, patient presented with worsening abdominal pain present when he eats, with persistently elevated liver function tests and an elevated lipase. (cholangitis  choledocholithiasis  gallstone pancreatitis status post ERCP-endoscopic retrograde cholangiopancreatography with biliary sphincterotomy and balloon sweeping)  Patient has discharge instruction to follow-up withprimary care provider, cardiology and gastroenterology post hospitalization.  Primary care provider's office is listed as providing transition of care (TOC).   For additional questions please contact:  Edwena Felty A. Avacyn Kloosterman, BSN, RN-BC [facility] PRIMARY CARE Navigator Cell: [phone]

## 2018-11-13 ENCOUNTER — Ambulatory Visit: Payer: Medicare Other | Admitting: Physician Assistant

## 2018-11-17 ENCOUNTER — Ambulatory Visit (INDEPENDENT_AMBULATORY_CARE_PROVIDER_SITE_OTHER): Payer: Medicare Other | Admitting: *Deleted

## 2018-11-17 DIAGNOSIS — I4891 Unspecified atrial fibrillation: Secondary | ICD-10-CM | POA: Diagnosis not present

## 2018-11-18 ENCOUNTER — Ambulatory Visit: Payer: Medicare Other | Admitting: Physician Assistant

## 2018-11-18 LAB — CUP PACEART REMOTE DEVICE CHECK
Date Time Interrogation Session: 20200303190924
Implantable Pulse Generator Implant Date: 20190306

## 2018-11-23 ENCOUNTER — Telehealth: Payer: Self-pay | Admitting: Gastroenterology

## 2018-11-23 NOTE — Telephone Encounter (Signed)
Thank you John. Happy to see patient in follow up. GM

## 2018-11-23 NOTE — Telephone Encounter (Signed)
Dr Rush Landmark and Henrene Pastor who should the pt follow up with in the office?

## 2018-11-23 NOTE — Telephone Encounter (Signed)
It does not matter.  I only know this patient remotely.  Since Dr. Rush Landmark did his recent procedure, he may follow-up with him.  Thanks for checking.

## 2018-11-23 NOTE — Telephone Encounter (Signed)
Pt had ERCP in hosp 2.26.20 by Dr. Rush Landmark.  Pt stated that Dr. Rush Landmark wanted pt to schedule for labs and a follow up with him.  Pt is a Dr. Henrene Pastor pt.  Please advise.

## 2018-11-23 NOTE — Telephone Encounter (Signed)
See Dr Henrene Pastor note

## 2018-11-24 NOTE — Progress Notes (Signed)
Carelink Summary Report / Loop Recorder 

## 2018-12-20 LAB — CUP PACEART REMOTE DEVICE CHECK
Date Time Interrogation Session: 20200405194108
Implantable Pulse Generator Implant Date: 20190306

## 2018-12-21 ENCOUNTER — Ambulatory Visit (INDEPENDENT_AMBULATORY_CARE_PROVIDER_SITE_OTHER): Payer: Medicare Other | Admitting: *Deleted

## 2018-12-21 DIAGNOSIS — I4891 Unspecified atrial fibrillation: Secondary | ICD-10-CM | POA: Diagnosis not present

## 2018-12-29 NOTE — Progress Notes (Signed)
Carelink Summary Report / Loop Recorder 

## 2018-12-30 ENCOUNTER — Encounter: Payer: Self-pay | Admitting: General Surgery

## 2018-12-31 ENCOUNTER — Ambulatory Visit (INDEPENDENT_AMBULATORY_CARE_PROVIDER_SITE_OTHER): Payer: Medicare Other | Admitting: Gastroenterology

## 2018-12-31 ENCOUNTER — Other Ambulatory Visit: Payer: Self-pay

## 2018-12-31 DIAGNOSIS — R198 Other specified symptoms and signs involving the digestive system and abdomen: Secondary | ICD-10-CM

## 2018-12-31 DIAGNOSIS — Z86010 Personal history of colonic polyps: Secondary | ICD-10-CM

## 2018-12-31 DIAGNOSIS — R945 Abnormal results of liver function studies: Secondary | ICD-10-CM | POA: Diagnosis not present

## 2018-12-31 DIAGNOSIS — K8050 Calculus of bile duct without cholangitis or cholecystitis without obstruction: Secondary | ICD-10-CM

## 2018-12-31 DIAGNOSIS — Z860101 Personal history of adenomatous and serrated colon polyps: Secondary | ICD-10-CM

## 2018-12-31 DIAGNOSIS — Z9049 Acquired absence of other specified parts of digestive tract: Secondary | ICD-10-CM

## 2018-12-31 DIAGNOSIS — K851 Biliary acute pancreatitis without necrosis or infection: Secondary | ICD-10-CM

## 2018-12-31 DIAGNOSIS — R7989 Other specified abnormal findings of blood chemistry: Secondary | ICD-10-CM

## 2018-12-31 NOTE — Patient Instructions (Addendum)
If you are age 75 or older, your body mass index should be between 23-30. Your There is no height or weight on file to calculate BMI. If this is out of the aforementioned range listed, please consider follow up with your Primary Care Provider.  If you are age 79 or younger, your body mass index should be between 19-25. Your There is no height or weight on file to calculate BMI. If this is out of the aformentioned range listed, please consider follow up with your Primary Care Provider.    You will need a MRI/MRCP in June of 2020 You will also need labs in 4-6 month( due before procedure)  It has been recommended to you by your physician that you have a(n) Colonoscopy+EUS (upper) at hospital in August of 2020 completed. We did not schedule the procedure(s) today due to Covid-19. Please contact our office at [phone] should you decide to have the procedure completed.    Thank you for choosing me and [facility].  Dr. Rush Landmark

## 2018-12-31 NOTE — Progress Notes (Signed)
[facility] VISIT   Primary Care Provider Merrilee Seashore, [address] [place] [address] [phone]  Patient Profile: James Owen is a 72 y.o. male with a pmh significant for chronic renal insufficiency, thyroid disease, GERD, history of colonic adenomatous polyp, status post cholecystectomy, status post ERCP for choledocholithiasis in setting of gallstone pancreatitis.  The patient presents to the [facility] for an evaluation and management of problem(s) noted below:  Problem List 1. Choledocholithiasis   2. Abnormal LFTs   3. Abnormal findings on esophagogastroduodenoscopy (EGD)   4. History of adenomatous polyp of colon   5. Gallstone pancreatitis   6. History of cholecystectomy     History of Present Illness Due to the COVID-19 Pandemic, this service was provided via telemedicine using audio only. Interactive audio and video telecommunications were attempted between this provider and patient, however failed, as patient did not have access to video capability and thus to provide timely and excellent care, we continued and completed visit with audio only. The patient was located at home. The provider was located in the office. The patient did consent to this visit and is aware of charges through their insurance. Other persons participating in this telemedicine service were none.  This is a patient that I met back at the end of February in the setting of presenting with gallstone pancreatitis.  In the ED after he had been seen by his primary care doctor and directed to the hospital due to jaundice and progressive pain we pursued a MRI/MRCP which identified choledocholithiasis.  The next day we proceeded with stone extraction and findings of cholangitis.  At the time of his side-viewing endoscopy the patient was found to have a bulging in the region of the minor papilla.  His MRI had not shown any  mass or lesion at that point in time.  The patient did well and was able to be discharged.  Overall since his discharge he has done well.  He says he is followed up with his primary care provider and they did labs but he does not know the results of these labs.  We have not been able to confirm the patient's liver tests pattern as of yet.  Otherwise patient is trying to stay safe in his self quarantining at home.  The patient recalls that he is due for a follow-up colonoscopy back in 2019 however did not pursue that as result of some medical issues.  He is willing to consider the role of this moving forward.  GI Review of Systems Positive as above Negative for pyrosis, dysphagia, odynophagia, nausea, vomiting, change in appetite, abdominal pain, change in bowel habits, melena, hematochezia  Review of Systems General: Denies fevers/chills/weight loss HEENT: Denies oral lesions Cardiovascular: Denies chest pain Pulmonary: Denies shortness of breath Gastroenterological: See HPI Genitourinary: Denies darkened urine Hematological: Denies easy bruising/bleeding Dermatological: Denies jaundice Psychological: Mood is stable   Medications Current Outpatient Medications  Medication Sig Dispense Refill   levothyroxine (SYNTHROID, LEVOTHROID) 50 MCG tablet Take 50 mcg by mouth daily before breakfast.     pantoprazole (PROTONIX) 40 MG tablet Take 40 mg by mouth daily.     Polyvinyl Alcohol-Povidone (REFRESH OP) Place 1 drop into the left eye daily.     No current facility-administered medications for this visit.     Allergies Allergies  Allergen Reactions   Vicodin [Hydrocodone-Acetaminophen] Other (See Comments)    Hallucinations    Histories Past Medical History:  Diagnosis Date  CKD (chronic kidney disease)    Thyroid disease    Past Surgical History:  Procedure Laterality Date   BIOPSY  11/11/2018   Procedure: BIOPSY;  Surgeon: Rush Landmark Telford Nab., MD;  Location: [facility];  Service: Gastroenterology;;   CHOLECYSTECTOMY N/A 09/19/2017   Procedure: LAPAROSCOPIC CHOLECYSTECTOMY;  Surgeon: Ileana Roup, MD;  Location: [facility];  Service: General;  Laterality: N/A;   ERCP N/A 11/11/2018   Procedure: ENDOSCOPIC RETROGRADE CHOLANGIOPANCREATOGRAPHY (ERCP);  Surgeon: Irving Copas., MD;  Location: [facility];  Service: Gastroenterology;  Laterality: N/A;   LOOP RECORDER INSERTION N/A 11/19/2017   Procedure: LOOP RECORDER INSERTION;  Surgeon: Deboraha Sprang, MD;  Location: [facility];  Service: Cardiovascular;  Laterality: N/A;   MULTIPLE TOOTH EXTRACTIONS     has all dentures   REMOVAL OF STONES  11/11/2018   Procedure: REMOVAL OF STONES;  Surgeon: Rush Landmark Telford Nab., MD;  Location: [facility];  Service: Gastroenterology;;   Joan Mayans  11/11/2018   Procedure: Joan Mayans;  Surgeon: Rush Landmark Telford Nab., MD;  Location: [facility];  Service: Gastroenterology;;   Social History   Socioeconomic History   Marital status: Widowed    Spouse name: Not on file   Number of children: 2   Years of education: Not on file   Highest education level: Associate degree: occupational, Hotel manager, or vocational program  Occupational History   Occupation: retired Estate agent strain: Not hard at all   Food insecurity:    Worry: Never true    Inability: Never true   Transportation needs:    Medical: No    Non-medical: No  Tobacco Use   Smoking status: Never Smoker   Smokeless tobacco: Current User    Types: Chew   Tobacco comment: occasionally  Substance and Sexual Activity   Alcohol use: Yes    Comment: SELDOM   Drug use: No   Sexual activity: Not Currently  Lifestyle   Physical activity:    Days per week: 5 days    Minutes per session: 60 min   Stress: Not at all  Relationships   Social connections:    Talks on phone: More than three times a week    Gets  together: More than three times a week    Attends religious service: More than 4 times per year    Active member of club or organization: Yes    Attends meetings of clubs or organizations: More than 4 times per year    Relationship status: Widowed   Intimate partner violence:    Fear of current or ex partner: No    Emotionally abused: No    Physically abused: No    Forced sexual activity: No  Other Topics Concern   Not on file  Social History Narrative   Not on file   Family History  Problem Relation Age of Onset   Melanoma Sister    Breast cancer Sister    Atrial fibrillation Sister    Colon cancer Neg Hx    Esophageal cancer Neg Hx    Inflammatory bowel disease Neg Hx    Liver disease Neg Hx    Pancreatic cancer Neg Hx    Rectal cancer Neg Hx    Stomach cancer Neg Hx    I have reviewed his medical, social, and family history in detail and updated the electronic medical record as necessary.    PHYSICAL EXAMINATION  Telehealth visit   REVIEW OF DATA  I reviewed the following data at the time of this encounter:  GI Procedures and Studies  February 2020 ERCP - Erythematous mucosa in the gastric body. Biopsies were taken for HP evaluation. - The region of the presumed minor papilla appeared to be prominent and bulging but no adenomatous appearing tissue was present - query a result of pancreatitis if it is the minor. - The major papilla was on the rim of a diverticulum but otherwise was normal. - Filling defects consistent with a stone and sludge were seen on the cholangiogram. - The entire main bile duct was moderately dilated, with stone causing an obstruction. - Cholangitis & Choledocholithiasis was found. Complete removal was accomplished by biliary sphincterotomy and balloon sweeping/trawl.  October 2014 colonoscopy Single polyp measuring 5 mm in size was found in the sigmoid colon polypectomy was performed via hot snare polypectomy. Moderate  diverticulosis was noted in the ascending colon and left colon The colon mucosa was otherwise normal. Pathology consistent with tubular adenoma with a plan for 5-year follow-up.  Laboratory Studies  Reviewed in epic  Imaging Studies  February 2020 CT abdomen pelvis IMPRESSION: 1. No CT findings of the abdomen or pelvis to explain pain, nausea, bloating, or weight loss. 2.  Chronic and incidental findings as detailed above.  November 10, 2018 MRI/MRCP IMPRESSION: Choledocholithiasis, with stone at the distal common bile duct above the ampulla. Estimated diameter 7 mm. There is associated mild extrahepatic biliary ductal dilatation. Recommend referral for GI evaluation and correlation with ERCP. Mild inflammatory changes at the pancreatic head. Recommend correlation with lab values if there is concern for early pancreatitis. Cholecystectomy.   ASSESSMENT  James Owen is a 73 y.o. male  with a pmh significant for chronic renal insufficiency, thyroid disease, GERD, history of colonic adenomatous polyp, status post cholecystectomy, status post ERCP for choledocholithiasis in setting of gallstone pancreatitis.  The patient is seen today for evaluation and management of:  1. Choledocholithiasis   2. Abnormal LFTs   3. Abnormal findings on esophagogastroduodenoscopy (EGD)   4. History of adenomatous polyp of colon   5. Gallstone pancreatitis   6. History of cholecystectomy    The patient seems to be doing clinically well and hemodynamically at least over the phone.  The patient's choledocholithiasis seems to have improved status post his ERCP.  At like to see if we can get his PCP records to ensure that his liver tests have normalized if they have not normalized then we will plan to repeat a hepatic function panel in the coming weeks.  I would like the patient to undergo a repeat MRI/MRCP to further evaluate the region near the minor papilla as although there was nothing on the recent MRI I  did see what suggested a bit of a bulging in the region and like to delineate whether that was a result of pancreatitis or if there is any other sort of mass/lesion.  Subsequently then pursue an endoscopic ultrasound to evaluate the region further if it is still seen on endoscopic evaluation if not then we will just pursue an upper endoscopy.  He will also need a colonoscopy for colon polyp surveillance as his last colonoscopy back in 2014 had suggested a 5-year follow-up.  We will plan to pursue these together.  I suspect we should get the MRI in June or July and then subsequently can pursue his EUS/colonoscopy in July or August of this year.  We have not scheduled this as a result of the COVID-19 pandemic but  will pursue that in the coming weeks.  The risks and benefits of endoscopic evaluation were discussed with the patient; these include but are not limited to the risk of perforation, infection, bleeding, missed lesions, lack of diagnosis, severe illness requiring hospitalization, as well as anesthesia and sedation related illnesses.  The patient is agreeable to proceed.  The risks and benefits of endoscopic evaluation were discussed with the patient; these include but are not limited to the risk of perforation, infection, bleeding, missed lesions, lack of diagnosis, severe illness requiring hospitalization, as well as anesthesia and sedation related illnesses.  The patient is agreeable to proceed.  All patient questions were answered, to the best of my ability, and the patient agrees to the aforementioned plan of action with follow-up as indicated.   PLAN  Attempt to get PCP records to confirm hepatic function panel has normalized; if unable to get or if biochemical tests have not normalized we will need to repeat MRI/MRCP in June 2020 Upper EUS plus colonoscopy in July or August of this year after MRI CP has been completed We will need basic labs to be performed prior to his procedures (at least 4  weeks before)   Orders Placed This Encounter  Procedures   CBC   Comp Met (CMET)   INR/PT    New Prescriptions   No medications on file   Modified Medications   No medications on file    Planned Follow Up No follow-ups on file.   Justice Britain, MD [facility] Office # [phone]

## 2019-01-03 ENCOUNTER — Encounter: Payer: Self-pay | Admitting: Gastroenterology

## 2019-01-22 ENCOUNTER — Ambulatory Visit (INDEPENDENT_AMBULATORY_CARE_PROVIDER_SITE_OTHER): Payer: Medicare Other | Admitting: *Deleted

## 2019-01-22 ENCOUNTER — Other Ambulatory Visit: Payer: Self-pay

## 2019-01-22 DIAGNOSIS — I4891 Unspecified atrial fibrillation: Secondary | ICD-10-CM | POA: Diagnosis not present

## 2019-01-24 LAB — CUP PACEART REMOTE DEVICE CHECK
Date Time Interrogation Session: 20200508201244
Implantable Pulse Generator Implant Date: 20190306

## 2019-01-26 NOTE — Progress Notes (Signed)
Carelink Summary Report / Loop Recorder 

## 2019-02-17 ENCOUNTER — Telehealth: Payer: Self-pay

## 2019-02-17 NOTE — Telephone Encounter (Signed)
Called patient and left message asking for return call. Need to get patient set-up for MRI/MRCP.

## 2019-02-18 ENCOUNTER — Other Ambulatory Visit: Payer: Self-pay

## 2019-02-18 DIAGNOSIS — K851 Biliary acute pancreatitis without necrosis or infection: Secondary | ICD-10-CM

## 2019-02-18 DIAGNOSIS — Z860101 Personal history of adenomatous and serrated colon polyps: Secondary | ICD-10-CM

## 2019-02-18 DIAGNOSIS — Z9049 Acquired absence of other specified parts of digestive tract: Secondary | ICD-10-CM

## 2019-02-18 DIAGNOSIS — Z86010 Personal history of colonic polyps: Secondary | ICD-10-CM

## 2019-02-18 DIAGNOSIS — R198 Other specified symptoms and signs involving the digestive system and abdomen: Secondary | ICD-10-CM

## 2019-02-18 DIAGNOSIS — K8050 Calculus of bile duct without cholangitis or cholecystitis without obstruction: Secondary | ICD-10-CM

## 2019-02-18 NOTE — Telephone Encounter (Addendum)
You have been scheduled for an MRI/MRCP at [facility]  on 03/16/2019. Your appointment time is 9:00am Please arrive 15 minutes prior to your appointment time for registration purposes. Please make certain not to have anything to eat or drink 6 hours prior to your test. In addition, if you have any metal in your body, have a pacemaker or defibrillator, please be sure to let your ordering physician know. This test typically takes 45 minutes to 1 hour to complete. Should you need to reschedule, please call [phone] to do so.

## 2019-02-18 NOTE — Telephone Encounter (Signed)
Pt returned call to schedule MRI/MRCP.  He will be available after 3 PM.

## 2019-02-19 ENCOUNTER — Telehealth: Payer: Self-pay

## 2019-02-19 DIAGNOSIS — Z9049 Acquired absence of other specified parts of digestive tract: Secondary | ICD-10-CM

## 2019-02-19 DIAGNOSIS — K851 Biliary acute pancreatitis without necrosis or infection: Secondary | ICD-10-CM

## 2019-02-19 DIAGNOSIS — K8050 Calculus of bile duct without cholangitis or cholecystitis without obstruction: Secondary | ICD-10-CM

## 2019-02-19 DIAGNOSIS — Z86010 Personal history of colonic polyps: Secondary | ICD-10-CM

## 2019-02-19 MED ORDER — PEG 3350-KCL-NA BICARB-NACL 420 G PO SOLR: 4000.0000 mL | Freq: Once | ORAL | 0 refills | Status: AC

## 2019-02-19 NOTE — Telephone Encounter (Signed)
Pt scheduled for Colon+EUS @ [facility] 04/07/19-7:30am. Pt has been informed. Instructions have been mailed to patient.

## 2019-02-24 ENCOUNTER — Telehealth: Payer: Self-pay | Admitting: Gastroenterology

## 2019-02-24 ENCOUNTER — Ambulatory Visit (INDEPENDENT_AMBULATORY_CARE_PROVIDER_SITE_OTHER): Payer: Medicare Other | Admitting: *Deleted

## 2019-02-24 DIAGNOSIS — I4891 Unspecified atrial fibrillation: Secondary | ICD-10-CM

## 2019-02-24 NOTE — Telephone Encounter (Signed)
Called and spoke with James Owen at GI, she states that she will give patient a call around 1:00pm this afternoon to get him scheduled. Left message on pt's voicemail with this information.

## 2019-02-24 NOTE — Telephone Encounter (Signed)
Patient said he has not heard back from you regarding scheduling a MRI

## 2019-02-25 LAB — CUP PACEART REMOTE DEVICE CHECK
Date Time Interrogation Session: 20200610203959
Implantable Pulse Generator Implant Date: 20190306

## 2019-03-05 NOTE — Progress Notes (Signed)
Carelink Summary Report / Loop Recorder 

## 2019-03-16 ENCOUNTER — Ambulatory Visit
Admission: RE | Admit: 2019-03-16 | Discharge: 2019-03-16 | Disposition: A | Discharge status: Home / Self Care | Payer: Medicare Other | Source: Ambulatory Visit | Attending: Gastroenterology | Admitting: Gastroenterology

## 2019-03-16 DIAGNOSIS — Z86010 Personal history of colonic polyps: Secondary | ICD-10-CM

## 2019-03-16 DIAGNOSIS — K851 Biliary acute pancreatitis without necrosis or infection: Secondary | ICD-10-CM

## 2019-03-16 DIAGNOSIS — Z9049 Acquired absence of other specified parts of digestive tract: Secondary | ICD-10-CM

## 2019-03-16 DIAGNOSIS — R198 Other specified symptoms and signs involving the digestive system and abdomen: Secondary | ICD-10-CM

## 2019-03-16 DIAGNOSIS — K8050 Calculus of bile duct without cholangitis or cholecystitis without obstruction: Secondary | ICD-10-CM

## 2019-03-16 MED ORDER — GADOBENATE DIMEGLUMINE 529 MG/ML IV SOLN
18.0000 mL | Freq: Once | INTRAVENOUS | Status: AC | PRN
  Administered 2019-03-16: 18 mL via INTRAVENOUS

## 2019-03-22 ENCOUNTER — Other Ambulatory Visit (INDEPENDENT_AMBULATORY_CARE_PROVIDER_SITE_OTHER): Payer: Medicare Other

## 2019-03-22 DIAGNOSIS — R7989 Other specified abnormal findings of blood chemistry: Secondary | ICD-10-CM

## 2019-03-22 DIAGNOSIS — R945 Abnormal results of liver function studies: Secondary | ICD-10-CM

## 2019-03-22 DIAGNOSIS — K851 Biliary acute pancreatitis without necrosis or infection: Secondary | ICD-10-CM | POA: Diagnosis not present

## 2019-03-22 DIAGNOSIS — Z9049 Acquired absence of other specified parts of digestive tract: Secondary | ICD-10-CM

## 2019-03-22 DIAGNOSIS — R198 Other specified symptoms and signs involving the digestive system and abdomen: Secondary | ICD-10-CM

## 2019-03-22 DIAGNOSIS — K8050 Calculus of bile duct without cholangitis or cholecystitis without obstruction: Secondary | ICD-10-CM | POA: Diagnosis not present

## 2019-03-22 LAB — COMPREHENSIVE METABOLIC PANEL
ALT: 12 U/L (ref 0–53)
AST: 16 U/L (ref 0–37)
Albumin: 4.2 g/dL (ref 3.5–5.2)
Alkaline Phosphatase: 75 U/L (ref 39–117)
BUN: 18 mg/dL (ref 6–23)
CO2: 28 mEq/L (ref 19–32)
Calcium: 9 mg/dL (ref 8.4–10.5)
Chloride: 105 mEq/L (ref 96–112)
Creatinine, Ser: 1.28 mg/dL (ref 0.40–1.50)
GFR: 54.83 mL/min — ABNORMAL LOW (ref >60.00)
Glucose, Bld: 78 mg/dL (ref 70–99)
Potassium: 4.3 mEq/L (ref 3.5–5.1)
Sodium: 141 mEq/L (ref 135–145)
Total Bilirubin: 0.4 mg/dL (ref 0.2–1.2)
Total Protein: 6.9 g/dL (ref 6.0–8.3)

## 2019-03-22 LAB — CBC
HCT: 47.2 % (ref 39.0–52.0)
Hemoglobin: 15.8 g/dL (ref 13.0–17.0)
MCHC: 33.4 g/dL (ref 30.0–36.0)
MCV: 92.5 fl (ref 78.0–100.0)
Platelets: 177 10*3/uL (ref 150.0–400.0)
RBC: 5.1 Mil/uL (ref 4.22–5.81)
RDW: 13.6 % (ref 11.5–15.5)
WBC: 6.1 10*3/uL (ref 4.0–10.5)

## 2019-03-22 LAB — PROTIME-INR
INR: 1 ratio (ref 0.8–1.0)
Prothrombin Time: 11.3 s (ref 9.6–13.1)

## 2019-03-29 ENCOUNTER — Ambulatory Visit (INDEPENDENT_AMBULATORY_CARE_PROVIDER_SITE_OTHER): Payer: Medicare Other | Admitting: *Deleted

## 2019-03-29 DIAGNOSIS — I4891 Unspecified atrial fibrillation: Secondary | ICD-10-CM

## 2019-03-30 LAB — CUP PACEART REMOTE DEVICE CHECK
Date Time Interrogation Session: 20200713221112
Implantable Pulse Generator Implant Date: 20190306

## 2019-04-02 ENCOUNTER — Other Ambulatory Visit (HOSPITAL_COMMUNITY): Admission: RE | Admit: 2019-04-02 | Payer: Medicare Other | Source: Ambulatory Visit

## 2019-04-05 ENCOUNTER — Other Ambulatory Visit (HOSPITAL_COMMUNITY): Payer: Medicare Other

## 2019-04-05 ENCOUNTER — Other Ambulatory Visit (HOSPITAL_COMMUNITY)
Admission: RE | Admit: 2019-04-05 | Discharge: 2019-04-05 | Disposition: A | Discharge status: Home / Self Care | Payer: Medicare Other | Source: Ambulatory Visit | Attending: Gastroenterology | Admitting: Gastroenterology

## 2019-04-05 DIAGNOSIS — Z1159 Encounter for screening for other viral diseases: Secondary | ICD-10-CM | POA: Diagnosis present

## 2019-04-06 ENCOUNTER — Other Ambulatory Visit: Payer: Self-pay

## 2019-04-06 ENCOUNTER — Encounter (HOSPITAL_COMMUNITY): Payer: Self-pay

## 2019-04-06 LAB — SARS CORONAVIRUS 2 (TAT 6-24 HRS): SARS Coronavirus 2: NEGATIVE

## 2019-04-06 NOTE — Anesthesia Preprocedure Evaluation (Addendum)
Anesthesia Evaluation  Patient identified by MRN, date of birth, ID band Patient awake    Reviewed: Allergy & Precautions, NPO status , Patient's Chart, lab work & pertinent test results  Airway Mallampati: II  TM Distance: >3 FB Neck ROM: Full    Dental no notable dental hx. (+) Upper Dentures, Lower Dentures   Pulmonary    Pulmonary exam normal breath sounds clear to auscultation       Cardiovascular Normal cardiovascular exam Rhythm:Regular Rate:Normal     Neuro/Psych negative neurological ROS  negative psych ROS   GI/Hepatic   Endo/Other    Renal/GU Renal disease     Musculoskeletal   Abdominal   Peds  Hematology   Anesthesia Other Findings   Reproductive/Obstetrics                            Anesthesia Physical Anesthesia Plan  ASA: III  Anesthesia Plan: MAC   Post-op Pain Management:    Induction: Intravenous  PONV Risk Score and Plan: Treatment may vary due to age or medical condition and Ondansetron  Airway Management Planned: Natural Airway and Simple Face Mask  Additional Equipment:   Intra-op Plan:   Post-operative Plan:   Informed Consent: I have reviewed the patients History and Physical, chart, labs and discussed the procedure including the risks, benefits and alternatives for the proposed anesthesia with the patient or authorized representative who has indicated his/her understanding and acceptance.     Dental advisory given  Plan Discussed with:   Anesthesia Plan Comments:        Anesthesia Quick Evaluation

## 2019-04-07 ENCOUNTER — Ambulatory Visit (HOSPITAL_COMMUNITY): Payer: Medicare Other | Admitting: Anesthesiology

## 2019-04-07 ENCOUNTER — Encounter (HOSPITAL_COMMUNITY): Payer: Self-pay | Admitting: *Deleted

## 2019-04-07 ENCOUNTER — Ambulatory Visit (HOSPITAL_COMMUNITY)
Admission: RE | Admit: 2019-04-07 | Discharge: 2019-04-07 | Disposition: A | Discharge status: Home / Self Care | Payer: Medicare Other | Attending: Gastroenterology | Admitting: Gastroenterology

## 2019-04-07 ENCOUNTER — Encounter (HOSPITAL_COMMUNITY)
Admission: RE | Disposition: A | Discharge status: Home / Self Care | Payer: Self-pay | Source: Home / Self Care | Attending: Gastroenterology

## 2019-04-07 DIAGNOSIS — K3189 Other diseases of stomach and duodenum: Secondary | ICD-10-CM | POA: Insufficient documentation

## 2019-04-07 DIAGNOSIS — Z8719 Personal history of other diseases of the digestive system: Secondary | ICD-10-CM | POA: Diagnosis not present

## 2019-04-07 DIAGNOSIS — K228 Other specified diseases of esophagus: Secondary | ICD-10-CM | POA: Insufficient documentation

## 2019-04-07 DIAGNOSIS — I899 Noninfective disorder of lymphatic vessels and lymph nodes, unspecified: Secondary | ICD-10-CM | POA: Diagnosis not present

## 2019-04-07 DIAGNOSIS — K210 Gastro-esophageal reflux disease with esophagitis: Secondary | ICD-10-CM | POA: Diagnosis not present

## 2019-04-07 DIAGNOSIS — K640 First degree hemorrhoids: Secondary | ICD-10-CM | POA: Insufficient documentation

## 2019-04-07 DIAGNOSIS — K644 Residual hemorrhoidal skin tags: Secondary | ICD-10-CM | POA: Diagnosis not present

## 2019-04-07 DIAGNOSIS — Z1211 Encounter for screening for malignant neoplasm of colon: Secondary | ICD-10-CM | POA: Diagnosis not present

## 2019-04-07 DIAGNOSIS — Z9889 Other specified postprocedural states: Secondary | ICD-10-CM | POA: Diagnosis not present

## 2019-04-07 DIAGNOSIS — N189 Chronic kidney disease, unspecified: Secondary | ICD-10-CM | POA: Insufficient documentation

## 2019-04-07 DIAGNOSIS — Z885 Allergy status to narcotic agent status: Secondary | ICD-10-CM | POA: Diagnosis not present

## 2019-04-07 DIAGNOSIS — I4891 Unspecified atrial fibrillation: Secondary | ICD-10-CM | POA: Diagnosis not present

## 2019-04-07 DIAGNOSIS — K209 Esophagitis, unspecified: Secondary | ICD-10-CM

## 2019-04-07 DIAGNOSIS — K5750 Diverticulosis of both small and large intestine without perforation or abscess without bleeding: Secondary | ICD-10-CM | POA: Insufficient documentation

## 2019-04-07 DIAGNOSIS — Z86010 Personal history of colonic polyps: Secondary | ICD-10-CM | POA: Diagnosis present

## 2019-04-07 DIAGNOSIS — K5710 Diverticulosis of small intestine without perforation or abscess without bleeding: Secondary | ICD-10-CM | POA: Diagnosis not present

## 2019-04-07 HISTORY — PX: BIOPSY: SHX5522

## 2019-04-07 HISTORY — DX: Cardiac arrhythmia, unspecified: I49.9

## 2019-04-07 HISTORY — PX: ESOPHAGOGASTRODUODENOSCOPY (EGD) WITH PROPOFOL: SHX5813

## 2019-04-07 HISTORY — PX: UPPER ESOPHAGEAL ENDOSCOPIC ULTRASOUND (EUS): SHX6562

## 2019-04-07 HISTORY — PX: COLONOSCOPY WITH PROPOFOL: SHX5780

## 2019-04-07 SURGERY — ESOPHAGOGASTRODUODENOSCOPY (EGD) WITH PROPOFOL
Anesthesia: Monitor Anesthesia Care

## 2019-04-07 MED ORDER — PHENYLEPHRINE HCL (PRESSORS) 10 MG/ML IV SOLN
INTRAVENOUS | Status: DC | PRN
  Administered 2019-04-07: 120 ug via INTRAVENOUS
  Administered 2019-04-07: 80 ug via INTRAVENOUS
  Administered 2019-04-07: 120 ug via INTRAVENOUS
  Administered 2019-04-07: 80 ug via INTRAVENOUS
  Administered 2019-04-07: 120 ug via INTRAVENOUS

## 2019-04-07 MED ORDER — PROPOFOL 10 MG/ML IV BOLUS
INTRAVENOUS | Status: AC
  Filled 2019-04-07: qty 20

## 2019-04-07 MED ORDER — OMEPRAZOLE 20 MG PO CPDR: 20.0000 mg | DELAYED_RELEASE_CAPSULE | Freq: Every day | ORAL | 5 refills | Status: DC

## 2019-04-07 MED ORDER — PROPOFOL 10 MG/ML IV BOLUS
INTRAVENOUS | Status: AC
  Filled 2019-04-07: qty 60

## 2019-04-07 MED ORDER — LACTATED RINGERS IV SOLN
INTRAVENOUS | Status: DC
  Administered 2019-04-07: 1000 mL via INTRAVENOUS

## 2019-04-07 MED ORDER — PROPOFOL 10 MG/ML IV BOLUS
INTRAVENOUS | Status: DC | PRN
  Administered 2019-04-07 (×5): 20 mg via INTRAVENOUS

## 2019-04-07 MED ORDER — GLYCOPYRROLATE 0.2 MG/ML IJ SOLN
INTRAMUSCULAR | Status: DC | PRN
  Administered 2019-04-07: 0.1 mg via INTRAVENOUS

## 2019-04-07 MED ORDER — PROPOFOL 500 MG/50ML IV EMUL
INTRAVENOUS | Status: DC | PRN
  Administered 2019-04-07: 150 ug/kg/min via INTRAVENOUS

## 2019-04-07 MED ORDER — LIDOCAINE HCL (CARDIAC) PF 100 MG/5ML IV SOSY
PREFILLED_SYRINGE | INTRAVENOUS | Status: DC | PRN
  Administered 2019-04-07: 100 mg via INTRAVENOUS

## 2019-04-07 SURGICAL SUPPLY — 25 items

## 2019-04-07 NOTE — Op Note (Signed)
[facility] - Inpatient Patient Name: James Owen Procedure Date: 04/07/2019 MRN: [id number] Attending MD: Justice Britain , MD Date of Birth: 1945/11/24 CSN: [id number] Age: 73 Admit Type: Outpatient Procedure:                Colonoscopy Indications:              Surveillance: Personal history of adenomatous                            polyps on last colonoscopy > 5 years ago Providers:                Justice Britain, MD, Cleda Daub, RN, Elspeth Cho Tech., Technician, Enrigue Catena, CRNA Referring MD:             Merrilee Seashore Medicines:                Monitored Anesthesia Care Complications:            No immediate complications. Estimated Blood Loss:     Estimated blood loss: none. Procedure:                Pre-Anesthesia Assessment:                           - Prior to the procedure, a History and Physical                            was performed, and patient medications and                            allergies were reviewed. The patient's tolerance of                            previous anesthesia was also reviewed. The risks                            and benefits of the procedure and the sedation                            options and risks were discussed with the patient.                            All questions were answered, and informed consent                            was obtained. Prior Anticoagulants: The patient has                            taken no previous anticoagulant or antiplatelet                            agents. ASA Grade Assessment: II - A patient with  mild systemic disease. After reviewing the risks                            and benefits, the patient was deemed in                            satisfactory condition to undergo the procedure.                           After obtaining informed consent, the colonoscope                            was passed under direct vision.  Throughout the                            procedure, the patient's blood pressure, pulse, and                            oxygen saturations were monitored continuously. The                            CF-HQ190L ([id number]) Olympus colonoscope was                            introduced through the anus and advanced to the 5                            cm into the ileum. The colonoscopy was performed                            without difficulty. The patient tolerated the                            procedure. The quality of the bowel preparation was                            adequate. The terminal ileum, ileocecal valve,                            appendiceal orifice, and rectum were photographed. Scope In: 8:39:18 AM Scope Out: 8:56:29 AM Scope Withdrawal Time: 0 hours 13 minutes 57 seconds  Total Procedure Duration: 0 hours 17 minutes 11 seconds  Findings:      The digital rectal exam findings include non-thrombosed external       hemorrhoids. Pertinent negatives include no palpable rectal lesions.      The terminal ileum and ileocecal valve appeared normal.      A large amount of semi-liquid stool was found in the entire colon,       interfering with visualization. Lavage of the area was performed using       copious amounts, resulting in clearance with adequate visualization.      Multiple small and large-mouthed diverticula were found in the       recto-sigmoid colon, sigmoid colon, descending colon and ascending colon       (left >right).  Non-bleeding non-thrombosed external and internal hemorrhoids were found       during retroflexion, during perianal exam and during digital exam. The       hemorrhoids were Grade I (internal hemorrhoids that do not prolapse). Impression:               - Non-thrombosed external hemorrhoids found on                            digital rectal exam.                           - The examined portion of the ileum was normal.                           -  Stool in the entire examined colon. Lavaged                            extensively with adequate visualization.                           - Diverticulosis in the recto-sigmoid colon, in the                            sigmoid colon, in the descending colon and in the                            ascending colon.                           - Non-bleeding non-thrombosed external and internal                            hemorrhoids. Moderate Sedation:      Not Applicable - Patient had care per Anesthesia. Recommendation:           - The patient will be observed post-procedure,                            until all discharge criteria are met.                           - Discharge patient to home.                           - Patient has a contact number available for                            emergencies. The signs and symptoms of potential                            delayed complications were discussed with the                            patient. Return to normal activities tomorrow.  Written discharge instructions were provided to the                            patient.                           - High fiber diet.                           - Use fiber, for example Citrucel, Fibercon, Konsyl                            or Metamucil on a daily basis.                           - Continue present medications.                           - Repeat colonoscopy 5-7 years for screening                            purposes based on overall patient's health and                            medical comorbidities (normally over the age of 32                            will consider stopping screening, but if life                            expectancy remains >10 years, we can consider an                            additional final colonoscopy after discussion with                            patient and PCP).                           - The findings and recommendations were discussed                             with the patient.                           - The findings and recommendations were discussed                            with the patient's family. Procedure Code(s):        --- Professional ---                           [phone], Colonoscopy, flexible; diagnostic, including                            collection of specimen(s) by brushing or  washing,                            when performed (separate procedure) Diagnosis Code(s):        --- Professional ---                           Z86.010, Personal history of colonic polyps                           K64.0, First degree hemorrhoids                           K64.4, Residual hemorrhoidal skin tags                           K57.30, Diverticulosis of large intestine without                            perforation or abscess without bleeding CPT copyright 2019 [employer]. All rights reserved. The codes documented in this report are preliminary and upon coder review may  be revised to meet current compliance requirements. Justice Britain, MD 04/07/2019 9:20:20 AM Number of Addenda: 0

## 2019-04-07 NOTE — Discharge Instructions (Signed)
YOU HAD AN ENDOSCOPIC PROCEDURE TODAY: Refer to the procedure report and other information in the discharge instructions given to you for any specific questions about what was found during the examination. If this information does not answer your questions, please call [place] office at [phone] to clarify.  ° °YOU SHOULD EXPECT: Some feelings of bloating in the abdomen. Passage of more gas than usual. Walking can help get rid of the air that was put into your GI tract during the procedure and reduce the bloating. If you had a lower endoscopy (such as a colonoscopy or flexible sigmoidoscopy) you may notice spotting of blood in your stool or on the toilet paper. Some abdominal soreness may be present for a day or two, also. ° °DIET: Your first meal following the procedure should be a light meal and then it is ok to progress to your normal diet. A half-sandwich or bowl of soup is an example of a good first meal. Heavy or fried foods are harder to digest and may make you feel nauseous or bloated. Drink plenty of fluids but you should avoid alcoholic beverages for 24 hours. If you had a esophageal dilation, please see attached instructions for diet.   ° °ACTIVITY: Your care partner should take you home directly after the procedure. You should plan to take it easy, moving slowly for the rest of the day. You can resume normal activity the day after the procedure however YOU SHOULD NOT DRIVE, use power tools, machinery or perform tasks that involve climbing or major physical exertion for 24 hours (because of the sedation medicines used during the test).  ° °SYMPTOMS TO REPORT IMMEDIATELY: °A gastroenterologist can be reached at any hour. Please call [phone]  for any of the following symptoms:  °Following lower endoscopy (colonoscopy, flexible sigmoidoscopy) °Excessive amounts of blood in the stool  °Significant tenderness, worsening of abdominal pains  °Swelling of the abdomen that is new, acute  °Fever of 100° or  higher  °Following upper endoscopy (EGD, EUS, ERCP, esophageal dilation) °Vomiting of blood or coffee ground material  °New, significant abdominal pain  °New, significant chest pain or pain under the shoulder blades  °Painful or persistently difficult swallowing  °New shortness of breath  °Black, tarry-looking or red, bloody stools ° °FOLLOW UP:  °If any biopsies were taken you will be contacted by phone or by letter within the next 1-3 weeks. Call [phone]  if you have not heard about the biopsies in 3 weeks.  °Please also call with any specific questions about appointments or follow up tests. ° °

## 2019-04-07 NOTE — Anesthesia Postprocedure Evaluation (Signed)
Anesthesia Post Note  Patient: James Owen  Procedure(s) Performed: ESOPHAGOGASTRODUODENOSCOPY (EGD) WITH PROPOFOL (N/A ) UPPER ESOPHAGEAL ENDOSCOPIC ULTRASOUND (EUS) (N/A ) COLONOSCOPY WITH PROPOFOL (N/A ) BIOPSY     Patient location during evaluation: Endoscopy Anesthesia Type: MAC Level of consciousness: awake and alert Pain management: pain level controlled Vital Signs Assessment: post-procedure vital signs reviewed and stable Respiratory status: spontaneous breathing, nonlabored ventilation, respiratory function stable and patient connected to nasal cannula oxygen Cardiovascular status: blood pressure returned to baseline and stable Postop Assessment: no apparent nausea or vomiting Anesthetic complications: no    Last Vitals:  Vitals:   04/07/19 0930 04/07/19 0940  BP: 121/71 118/76  Pulse: (!) 58 (!) 46  Resp: 15 12  Temp:    SpO2: 100% 99%    Last Pain:  Vitals:   04/07/19 0925  TempSrc:   PainSc: 0-No pain                 Barnet Glasgow

## 2019-04-07 NOTE — Transfer of Care (Signed)
Immediate Anesthesia Transfer of Care Note  Patient: James Owen  Procedure(s) Performed: ESOPHAGOGASTRODUODENOSCOPY (EGD) WITH PROPOFOL (N/A ) UPPER ESOPHAGEAL ENDOSCOPIC ULTRASOUND (EUS) (N/A ) COLONOSCOPY WITH PROPOFOL (N/A ) BIOPSY  Patient Location: PACU  Anesthesia Type:MAC  Level of Consciousness: awake, sedated and patient cooperative  Airway & Oxygen Therapy: Patient Spontanous Breathing and Patient connected to nasal cannula oxygen  Post-op Assessment: Report given to RN, Post -op Vital signs reviewed and stable and Patient moving all extremities X 4  Post vital signs: stable  Last Vitals:  Vitals Value Taken Time  BP 92/57 04/07/19 0910  Temp    Pulse 61 04/07/19 0910  Resp 13 04/07/19 0910  SpO2 100 % 04/07/19 0910  Vitals shown include unvalidated device data.  Last Pain:  Vitals:   04/07/19 0648  TempSrc: Oral  PainSc: 0-No pain         Complications: No apparent anesthesia complications

## 2019-04-07 NOTE — H&P (Signed)
GASTROENTEROLOGY OUTPATIENT PROCEDURE H&P NOTE   Primary Care Physician: Merrilee Seashore, MD  HPI: James Owen is a 73 y.o. male who presents for EGD/EUS with Colonoscopy.  Past Medical History:  Diagnosis Date  . CKD (chronic kidney disease)   . Dysrhythmia    leep for a fib no episodes in a year  . Thyroid disease    Past Surgical History:  Procedure Laterality Date  . BIOPSY  11/11/2018   Procedure: BIOPSY;  Surgeon: Rush Landmark Telford Nab., MD;  Location: [place];  Service: Gastroenterology;;  . CHOLECYSTECTOMY N/A 09/19/2017   Procedure: LAPAROSCOPIC CHOLECYSTECTOMY;  Surgeon: Ileana Roup, MD;  Location: [facility];  Service: General;  Laterality: N/A;  . ERCP N/A 11/11/2018   Procedure: ENDOSCOPIC RETROGRADE CHOLANGIOPANCREATOGRAPHY (ERCP);  Surgeon: Irving Copas., MD;  Location: [place];  Service: Gastroenterology;  Laterality: N/A;  . LOOP RECORDER INSERTION N/A 11/19/2017   Procedure: LOOP RECORDER INSERTION;  Surgeon: Deboraha Sprang, MD;  Location: [facility];  Service: Cardiovascular;  Laterality: N/A;  . MULTIPLE TOOTH EXTRACTIONS     has all dentures  . REMOVAL OF STONES  11/11/2018   Procedure: REMOVAL OF STONES;  Surgeon: Rush Landmark Telford Nab., MD;  Location: [place];  Service: Gastroenterology;;  . Joan Mayans  11/11/2018   Procedure: Joan Mayans;  Surgeon: Irving Copas., MD;  Location: [place];  Service: Gastroenterology;;   Current Facility-Administered Medications  Medication Dose Route Frequency Provider Last Rate Last Dose  . lactated ringers infusion   Intravenous Continuous Mansouraty, Telford Nab., MD 10 mL/hr at 04/07/19 0658 1,000 mL at 04/07/19 9470   Allergies  Allergen Reactions  . Vicodin [Hydrocodone-Acetaminophen] Other (See Comments)    Hallucinations   Family History  Problem Relation Age of Onset  . Melanoma Sister   . Breast cancer Sister   . Atrial fibrillation Sister    . Colon cancer Neg Hx   . Esophageal cancer Neg Hx   . Inflammatory bowel disease Neg Hx   . Liver disease Neg Hx   . Pancreatic cancer Neg Hx   . Rectal cancer Neg Hx   . Stomach cancer Neg Hx    Social History   Socioeconomic History  . Marital status: Widowed    Spouse name: Not on file  . Number of children: 2  . Years of education: Not on file  . Highest education level: Associate degree: occupational, Hotel manager, or vocational program  Occupational History  . Occupation: retired Arts administrator  Social Needs  . Financial resource strain: Not hard at all  . Food insecurity    Worry: Never true    Inability: Never true  . Transportation needs    Medical: No    Non-medical: No  Tobacco Use  . Smoking status: Never Smoker  . Smokeless tobacco: Current User    Types: Chew  . Tobacco comment: occasionally  Substance and Sexual Activity  . Alcohol use: Not Currently    Comment: SELDOM  . Drug use: No  . Sexual activity: Not Currently  Lifestyle  . Physical activity    Days per week: 5 days    Minutes per session: 60 min  . Stress: Not at all  Relationships  . Social connections    Talks on phone: More than three times a week    Gets together: More than three times a week    Attends religious service: More than 4 times per year    Active member of club or  organization: Yes    Attends meetings of clubs or organizations: More than 4 times per year    Relationship status: Widowed  . Intimate partner violence    Fear of current or ex partner: No    Emotionally abused: No    Physically abused: No    Forced sexual activity: No  Other Topics Concern  . Not on file  Social History Narrative  . Not on file    Physical Exam: Vital signs in last 24 hours: Temp:  [98.2 F (36.8 C)] 98.2 F (36.8 C) (07/22 0648) Pulse Rate:  [60] 60 (07/22 0648) Resp:  [20] 20 (07/22 0648) BP: (149)/(87) 149/87 (07/22 0648) SpO2:  [100 %] 100 % (07/22 0648) Weight:  [88.5 kg] 88.5 kg  (07/22 0648)   GEN: NAD EYE: Sclerae anicteric ENT: MMM CV: RR without R/Gs GI: Soft, NT/ND NEURO:  Alert & Oriented x 3  Lab Results: No results for input(s): WBC, HGB, HCT, PLT in the last 72 hours. BMET No results for input(s): NA, K, CL, CO2, GLUCOSE, BUN, CREATININE, CALCIUM in the last 72 hours. LFT No results for input(s): PROT, ALBUMIN, AST, ALT, ALKPHOS, BILITOT, BILIDIR, IBILI in the last 72 hours. PT/INR No results for input(s): LABPROT, INR in the last 72 hours.   Impression / Plan: This is a 73 y.o.male who presents for EGD/EUS with Colonoscopy.  The risks of EUS including bleeding, infection, aspiration pneumonia and intestinal perforation were discussed as was the possibility it may not give a definitive diagnosis.  If a biopsy of the pancreas is done as part of the EUS, there is an additional risk of pancreatitis at the rate of about 1%.  It was explained that procedure related pancreatitis is typically mild, although can be severe and even life threatening, which is why we do not perform random pancreatic biopsies and only biopsy a lesion we feel is concerning enough to warrant the risk.  The risks and benefits of endoscopic evaluation were discussed with the patient; these include but are not limited to the risk of perforation, infection, bleeding, missed lesions, lack of diagnosis, severe illness requiring hospitalization, as well as anesthesia and sedation related illnesses.  The patient is agreeable to proceed.    Justice Britain, MD [facility] Office # [phone]

## 2019-04-07 NOTE — Anesthesia Procedure Notes (Signed)
Procedure Name: MAC Date/Time: 04/07/2019 7:40 AM Performed by: Lissa Morales, CRNA Pre-anesthesia Checklist: Patient identified, Emergency Drugs available, Suction available, Patient being monitored and Timeout performed Patient Re-evaluated:Patient Re-evaluated prior to induction Oxygen Delivery Method: Nasal cannula Placement Confirmation: positive ETCO2

## 2019-04-07 NOTE — Op Note (Signed)
[facility] Patient Name: James Owen Procedure Date: 04/07/2019 MRN: [id number] Attending MD: Justice Britain , MD Date of Birth: [birth year] CSN: [id number] Age: 73 Admit Type: Outpatient Procedure:                Upper EUS Indications:              Duodenal deformity on endoscopy/Subepithelial tumor                            vs. extrinsic compression, History of gallstone                            pancreatitis s/p ERCP in 2020 Providers:                Justice Britain, MD, Cleda Daub, RN, Elspeth Cho Tech., Technician, Enrigue Catena, CRNA Referring MD:              Medicines:                Monitored Anesthesia Care Complications:            No immediate complications. Estimated Blood Loss:     Estimated blood loss was minimal. Procedure:                Pre-Anesthesia Assessment:                           - Prior to the procedure, a History and Physical                            was performed, and patient medications and                            allergies were reviewed. The patient's tolerance of                            previous anesthesia was also reviewed. The risks                            and benefits of the procedure and the sedation                            options and risks were discussed with the patient.                            All questions were answered, and informed consent                            was obtained. Prior Anticoagulants: The patient has                            taken no previous anticoagulant or antiplatelet  agents. ASA Grade Assessment: II - A patient with                            mild systemic disease. After reviewing the risks                            and benefits, the patient was deemed in                            satisfactory condition to undergo the procedure.                           After obtaining informed consent, the endoscope was                       passed under direct vision. Throughout the                            procedure, the patient's blood pressure, pulse, and                            oxygen saturations were monitored continuously. The                            [id number] ([id number]) Olympus Radial EUS was                            introduced through the mouth, and advanced to the                            second part of duodenum. The upper EUS was                            accomplished without difficulty. The patient                            tolerated the procedure. Scope In: Scope Out: Findings:      ENDOSCOPIC FINDING: :      No gross lesions were noted in the proximal esophagus, in the mid       esophagus and in the distal esophagus.      LA Grade A (one or more mucosal breaks less than 5 mm, not extending       between tops of 2 mucosal folds) esophagitis with no bleeding was found       at the gastroesophageal junction.      The Z-line was irregular and was found 45 cm from the incisors.      No gross lesions were noted in the entire examined stomach.      No gross lesions were noted in the duodenal bulb or in the D1/D2 sweep       (however the sweep had a very tight passage).      A diverticulum was found in the region of the major papilla.      There was evidence of a patent sphincterotomy from previous ERCP.      An area, proximal to the diverticulum and  to the ampulla - likely in the       region near the minor papilla (could not be visualized today and no       secretin was administered), showed evidence of some prolapsing tissue vs       mild submucosal nodularity. Pillow sign was positive. After EUS,       tunneled biopsies were taken with a cold forceps for histology purposes.      ENDOSONOGRAPHIC FINDING: :      There was no sign of significant endosonographic abnormality in the       pancreatic head. The pancreatic duct measured up to 1.7 mm in diameter.       No masses, the  pancreatic duct was thin in caliber in this region.      There was dilation in the common bile duct which measured up to 7.1 mm       but no evidence of stone or sludge material was present.      Endosonographic imaging of the ampulla showed no intramural       (subepithelial) lesion. There was no evidence of an intramural lesion at       the minor papilla based on the radial EUS views today.      Endosonographic imaging in the visualized portion of the liver showed no       lesion.      No malignant-appearing lymph nodes were visualized in the celiac region       (level 20) and porta hepatis region.      The celiac region was visualized. Impression:               EGD Impression:                           - No gross lesions in esophagus expect for LA Grade                            A esophagitis at the [employer]. Z-line irregular,                            45 cm from the incisors.                           - No gross lesions in the stomach.                           - No gross lesions in the duodenal bulb or D1/D2                            sweep.                           - Duodenal diverticulum at region of ampulla..                           - Patent sphincterotomy was found.                           - Prolapsing tissue vs submucosal lesion found in  the region of the minor papilla (could not                            visualize the minor however). Pillow sign positive.                            Tunnel biopsied.                           EUS Impression:                           - There was no sign of significant pathology in the                            pancreatic head.                           - There was dilation in the common bile duct which                            measured up to 7 mm. No stones or sludge noted.                           - No malignant-appearing lymph nodes were                            visualized in the celiac region  (level 20) and                            porta hepatis region. Moderate Sedation:      Not Applicable - Patient had care per Anesthesia. Recommendation:           - Proceed to scheduled colonoscopy.                           - Await path results.                           - Start Omeprazole 20 mg daily to aid in healing of                            esophagitis and see if patient notices any changes                            in regards to symptoms of GERD/Dyspepsia                            (previously denied).                           - Will consider a repeat MRI/MRICP vs EUS in 1-year                            for surveillance of the region of the  minor papilla                            if biopsies are unremarkable.                           - Follow up in clinic in 2-3 months to evaluate him                            while on PPI therapy.                           - The findings and recommendations were discussed                            with the patient.                           - The findings and recommendations were discussed                            with the patient's family. Procedure Code(s):        --- Professional ---                           [phone], Esophagogastroduodenoscopy, flexible,                            transoral; with endoscopic ultrasound examination                            limited to the esophagus, stomach or duodenum, and                            adjacent structures                           43239, Esophagogastroduodenoscopy, flexible,                            transoral; with biopsy, single or multiple Diagnosis Code(s):        --- Professional ---                           K20.9, Esophagitis, unspecified                           K22.8, Other specified diseases of esophagus                           Z98.890, Other specified postprocedural states                           K31.89, Other diseases of stomach and duodenum                            I89.9, Noninfective disorder of lymphatic vessels  and lymph nodes, unspecified                           K57.10, Diverticulosis of small intestine without                            perforation or abscess without bleeding                           K83.8, Other specified diseases of biliary tract CPT copyright 2019 American Medical Association. All rights reserved. The codes documented in this report are preliminary and upon coder review may  be revised to meet current compliance requirements. Justice Britain, MD 04/07/2019 9:35:01 AM Number of Addenda: 0

## 2019-04-08 ENCOUNTER — Encounter (HOSPITAL_COMMUNITY): Payer: Self-pay | Admitting: Gastroenterology

## 2019-04-08 ENCOUNTER — Encounter: Payer: Self-pay | Admitting: Gastroenterology

## 2019-04-08 NOTE — Progress Notes (Signed)
Carelink Summary Report / Loop Recorder 

## 2019-05-02 LAB — CUP PACEART REMOTE DEVICE CHECK
Date Time Interrogation Session: 20200815224148
Implantable Pulse Generator Implant Date: 20190306

## 2019-05-03 ENCOUNTER — Ambulatory Visit (INDEPENDENT_AMBULATORY_CARE_PROVIDER_SITE_OTHER): Payer: Medicare Other | Admitting: *Deleted

## 2019-05-03 DIAGNOSIS — I4891 Unspecified atrial fibrillation: Secondary | ICD-10-CM | POA: Diagnosis not present

## 2019-05-11 NOTE — Progress Notes (Signed)
Carelink Summary Report / Loop Recorder 

## 2019-06-03 ENCOUNTER — Ambulatory Visit (INDEPENDENT_AMBULATORY_CARE_PROVIDER_SITE_OTHER): Payer: Medicare Other | Admitting: *Deleted

## 2019-06-03 DIAGNOSIS — I4891 Unspecified atrial fibrillation: Secondary | ICD-10-CM | POA: Diagnosis not present

## 2019-06-04 LAB — CUP PACEART REMOTE DEVICE CHECK
Date Time Interrogation Session: 20200917233851
Implantable Pulse Generator Implant Date: 20190306

## 2019-06-07 NOTE — Progress Notes (Signed)
Carelink Summary Report / Loop Recorder 

## 2019-07-06 ENCOUNTER — Ambulatory Visit (INDEPENDENT_AMBULATORY_CARE_PROVIDER_SITE_OTHER): Payer: Medicare Other | Admitting: *Deleted

## 2019-07-06 DIAGNOSIS — I4891 Unspecified atrial fibrillation: Secondary | ICD-10-CM

## 2019-07-07 LAB — CUP PACEART REMOTE DEVICE CHECK
Date Time Interrogation Session: 20201020234418
Implantable Pulse Generator Implant Date: 20190306

## 2019-07-12 ENCOUNTER — Encounter: Payer: Self-pay | Admitting: Gastroenterology

## 2019-07-12 ENCOUNTER — Telehealth: Payer: Self-pay

## 2019-07-12 NOTE — Telephone Encounter (Signed)
Dr Rush Landmark there is no letter attached to this pt.  Please advise

## 2019-07-12 NOTE — Telephone Encounter (Signed)
Patty, Thank you for letting me know. I have completed a letter and now routed it to you. Clinic follow up in coming months as per patient preference. MRI imaging order as per previous notation in July 2021. Thanks. GM

## 2019-07-12 NOTE — Telephone Encounter (Signed)
Recall message sent to myself for pt to have MRI letter mailed

## 2019-07-12 NOTE — Telephone Encounter (Signed)
-----   Message from Timothy Lasso, RN sent at 04/09/2019  8:58 AM EDT ----- Please send letter when able.  Please schedule a follow-up in clinic in 2 to 3 months.  Please proceed with a reminder to proceed with a MRI/MRCP ordered for July 2021.  Thank you.

## 2019-07-21 NOTE — Progress Notes (Signed)
Carelink Summary Report / Loop Recorder 

## 2019-08-09 ENCOUNTER — Ambulatory Visit (INDEPENDENT_AMBULATORY_CARE_PROVIDER_SITE_OTHER): Payer: Medicare Other | Admitting: *Deleted

## 2019-08-09 DIAGNOSIS — I4891 Unspecified atrial fibrillation: Secondary | ICD-10-CM

## 2019-08-09 LAB — CUP PACEART REMOTE DEVICE CHECK
Date Time Interrogation Session: 20201122184550
Implantable Pulse Generator Implant Date: 20190306

## 2019-08-15 IMAGING — MR MR ABDOMEN WO/W CM MRCP
12 of 20 series · 25 of 48 positions shown · IV contrast (multihance)
Comparison: Multiple exams, including 11/10/2018 and CT from
11/05/2018

CLINICAL DATA: Choledocholithiasis, pancreatitis.

EXAM:
MRI ABDOMEN WITHOUT AND WITH CONTRAST (INCLUDING MRCP)
TECHNIQUE: Multiplanar multisequence MR imaging of the abdomen was performed
both before and after the administration of intravenous contrast.
Heavily T2-weighted images of the biliary and pancreatic ducts were
obtained, and three-dimensional MRCP images were rendered by post
processing.
CONTRAST:  18mL MULTIHANCE GADOBENATE DIMEGLUMINE 529 MG/ML IV SOLN
Creatinine was obtained on site at [HOSPITAL] at [HOSPITAL].
Results: Creatinine 1.3 mg/dL.

[Series 3: cor haste · coronal · 5.0mm · 0.74mm/px · 2 of 35 slices shown]
[im 1/35]
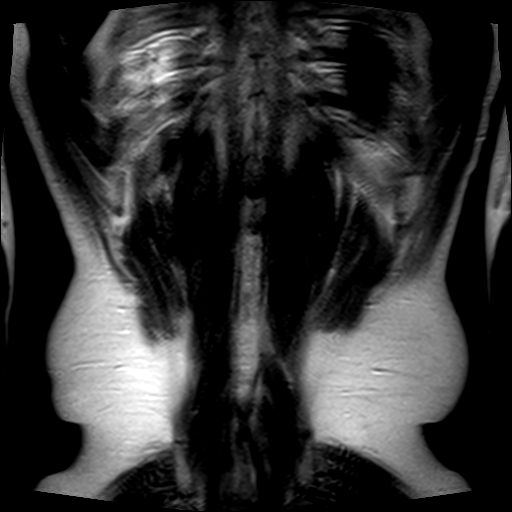
[im 35/35]
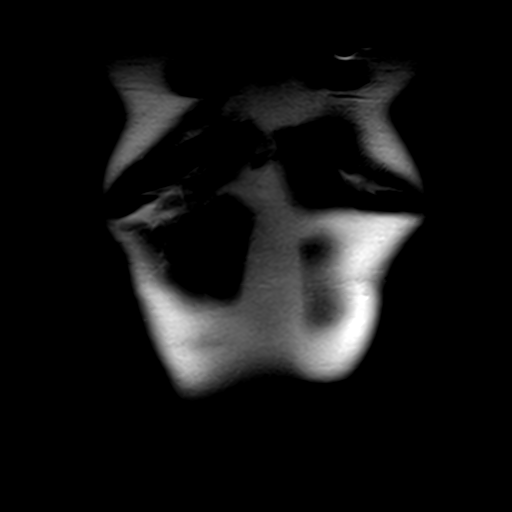

[Series 4: bSSFP · coronal · 5.0mm · 0.78mm/px · 2 of 32 slices shown (1 of 2)]
[im 1/32]
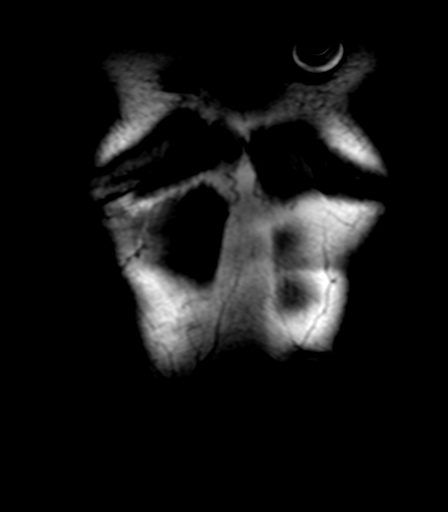
[im 32/32]
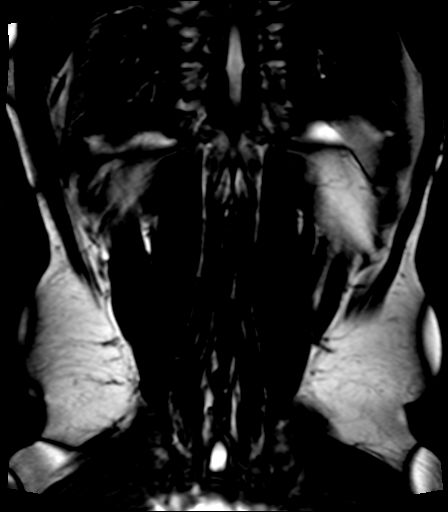

[Series 5: T2 · coronal · 3.0mm · 0.70mm/px · 2 of 48 slices shown (1 of 2)]
[im 1/48]
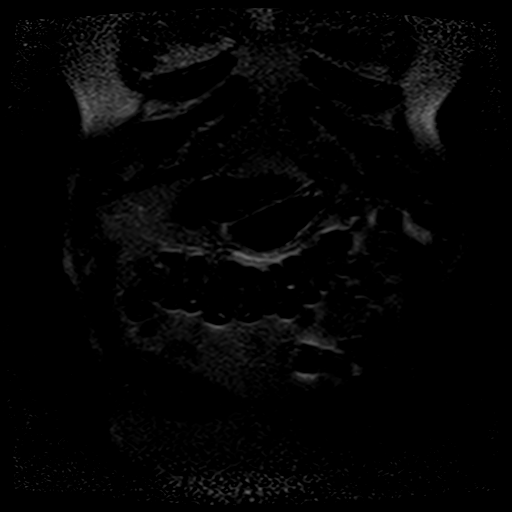
[im 48/48]
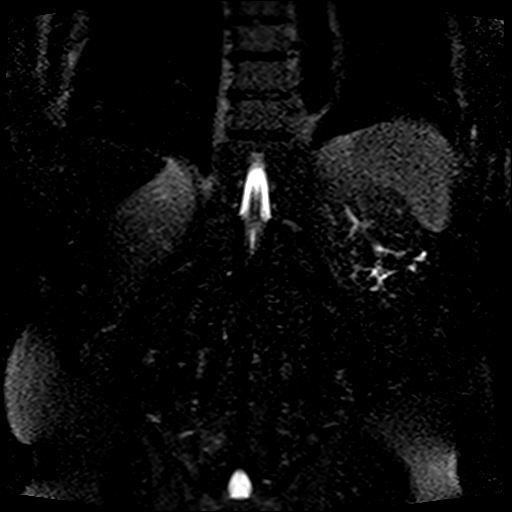

[Series 6: axial haste · axial · 6.0mm · 0.74mm/px · 1 of 32 slices shown]
[im 1/32]
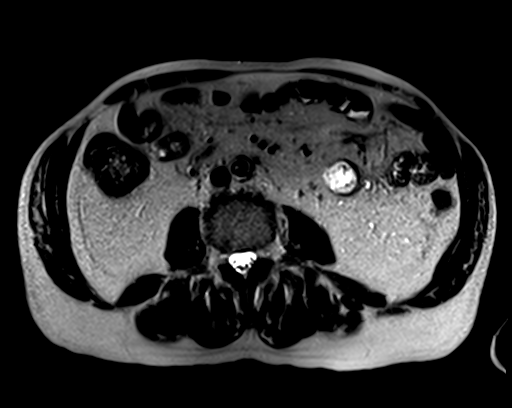

[Series 9: bSSFP · axial · 4.5mm · 0.74mm/px · z∈[-74,+133]mm · 2 of 47 slices shown (2 of 2)]
[im 1/47]
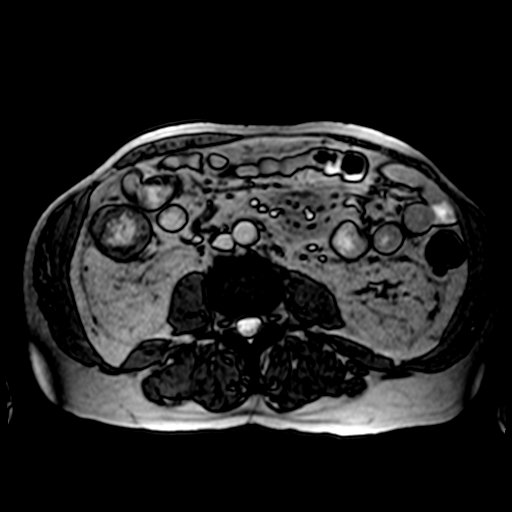
[im 47/47]
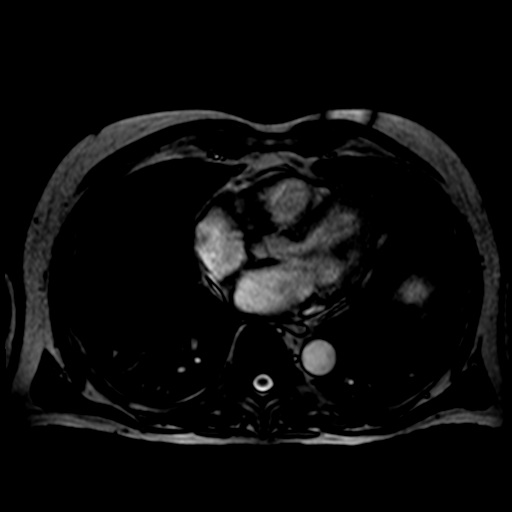

[Series 12: T1 · axial · 6.0mm · 0.74mm/px · z∈[-63,+128]mm · 2 of 60 slices shown]
[im 1/60]
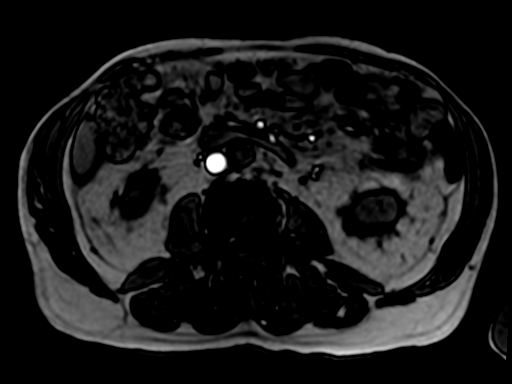
[im 60/60]
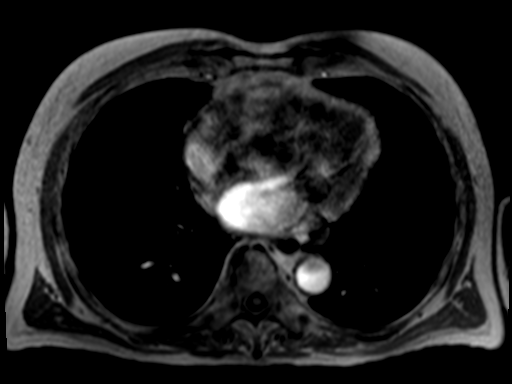

[Series 13: T2 · axial · 6.0mm · 1.12mm/px · 1 of 30 slices shown (2 of 2)]
[im 1/30]
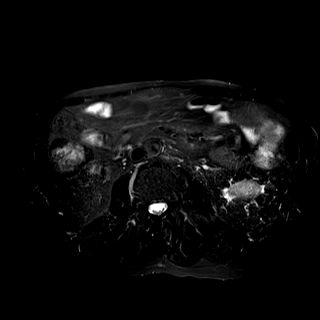

[Series 16: ep2d_diff_b50_500_800_p2_trig · axial · 6.0mm · 1.98mm/px · z∈[-58,+150]mm · 3 of 90 slices shown]
[im 1/90]
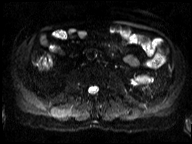
[im 45/90]
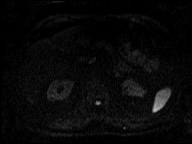
[im 90/90]
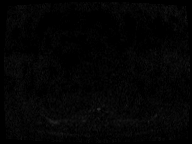

[Series 17: ep2d_[id number]_500_800_p2_trig_adc · axial · 6.0mm · 1.98mm/px · 1 of 30 slices shown]
[im 1/30]
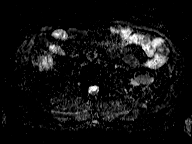

[Series 18: T1 dynamic · axial · non-contrast · 2.5mm · 0.74mm/px · z∈[-83,+135]mm · 3 of 88 slices shown]
[im 1/88]
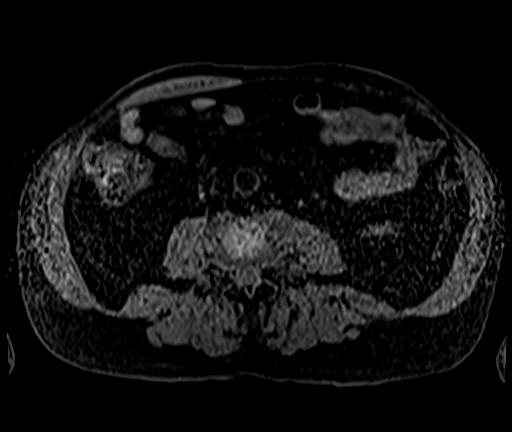
[im 44/88]
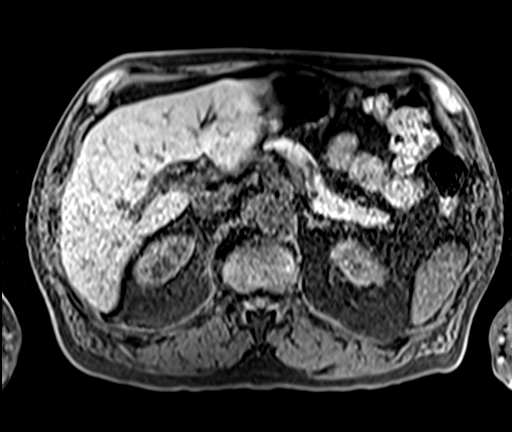
[im 88/88]
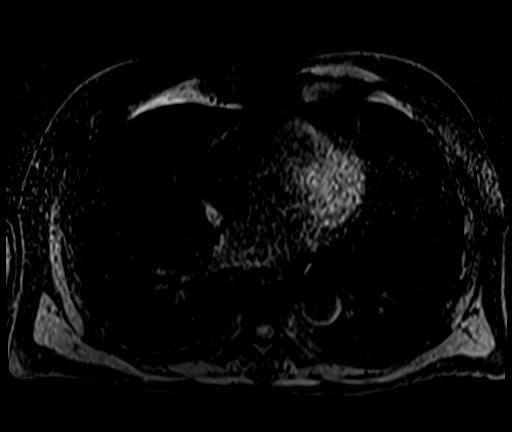

[Series 19: T1 dynamic post-contrast · axial · 2.5mm · 0.74mm/px · z∈[-83,+135]mm · 3 of 88 slices shown (1 of 2)]
[im 1/88]
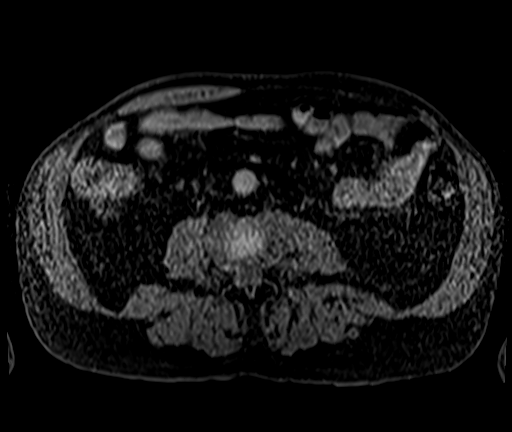
[im 44/88]
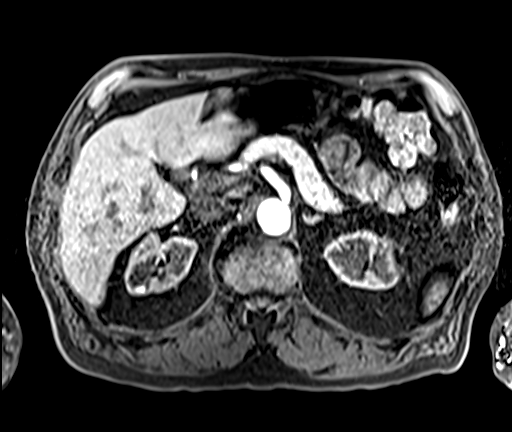
[im 88/88]
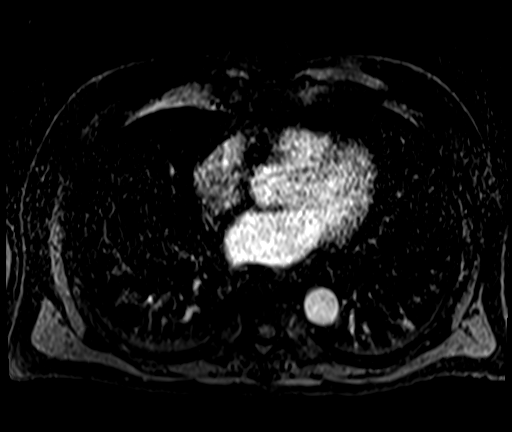

[Series 20: T1 dynamic post-contrast · axial · 2.5mm · 0.74mm/px · z∈[-83,+135]mm · 3 of 88 slices shown (2 of 2)]
[im 1/88]
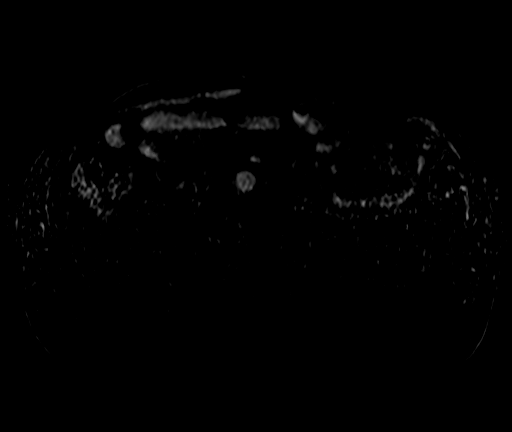
[im 44/88]
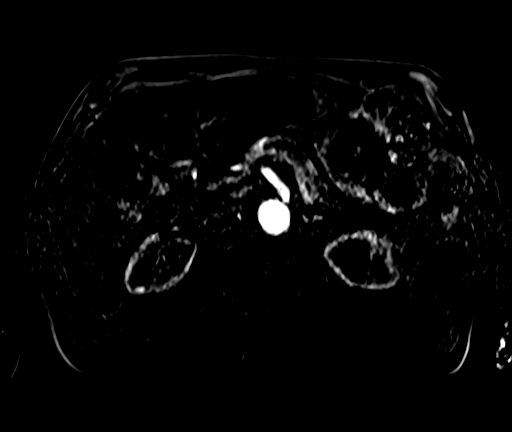
[im 88/88]
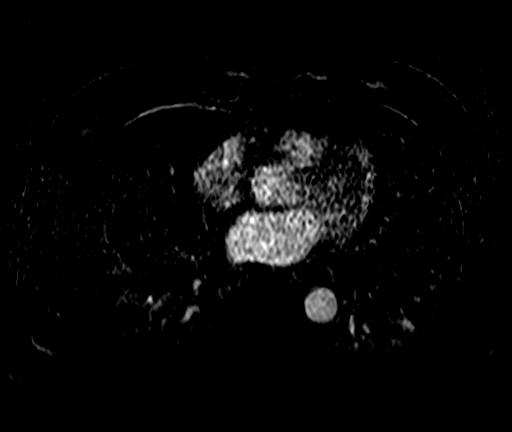

[25 of 48 positions shown; findings below may reference images not displayed]

FINDINGS: Despite efforts by the technologist and patient, motion artifact is
present on today's exam and could not be eliminated. This reduces
exam sensitivity and specificity.

Lower chest: Unremarkable

Hepatobiliary: Cholecystectomy with the metal clip slightly
obscuring the confluence of the right and left hepatic ducts on the
MRCP images. The common bile duct measures up to 8 mm in diameter. I
do not see a definite filling defect in the biliary tree and the
distal tapering of the common bile duct is in the normal conical
fashion. No significant focal enhancing lesion of the liver.

Pancreas:  Unremarkable

Spleen:  Unremarkable

Adrenals/Urinary Tract: Adrenal glands normal. Benign appearing
bilateral renal cysts including a larger left parapelvic cyst which
splays elements of the renal hilum on the left.

Stomach/Bowel: Small periampullary duodenal diverticulum. Descending
colon diverticulosis.

Vascular/Lymphatic:  Unremarkable

Other:  No supplemental non-categorized findings.

Musculoskeletal: Mild lumbar spondylosis and degenerative disc
disease.
IMPRESSION: 1. No current filling defect in the biliary tree. The confluence of
the right and left hepatic ducts is somewhat obscured by the metal
clip artifact. Previous accentuated wall enhancement in the
extrahepatic biliary tree is no longer present.
2. Small periampullary duodenal diverticulum.
3. No specific pancreatic abnormality is observed.
4. Bilateral renal cysts.

## 2019-09-09 ENCOUNTER — Ambulatory Visit (INDEPENDENT_AMBULATORY_CARE_PROVIDER_SITE_OTHER): Payer: Medicare Other | Admitting: *Deleted

## 2019-09-09 DIAGNOSIS — I4891 Unspecified atrial fibrillation: Secondary | ICD-10-CM | POA: Diagnosis not present

## 2019-09-09 LAB — CUP PACEART REMOTE DEVICE CHECK
Date Time Interrogation Session: 20201223230848
Implantable Pulse Generator Implant Date: 20190306

## 2019-09-11 NOTE — Progress Notes (Signed)
ILR remote 

## 2019-10-11 ENCOUNTER — Ambulatory Visit (INDEPENDENT_AMBULATORY_CARE_PROVIDER_SITE_OTHER): Payer: Medicare Other | Admitting: *Deleted

## 2019-10-11 DIAGNOSIS — I4891 Unspecified atrial fibrillation: Secondary | ICD-10-CM

## 2019-10-11 LAB — CUP PACEART REMOTE DEVICE CHECK
Date Time Interrogation Session: 20210124232743
Implantable Pulse Generator Implant Date: 20190306

## 2019-10-15 ENCOUNTER — Ambulatory Visit: Payer: Medicare Other

## 2019-10-17 ENCOUNTER — Other Ambulatory Visit: Payer: Self-pay | Admitting: Gastroenterology

## 2019-10-23 ENCOUNTER — Ambulatory Visit: Payer: Medicare Other | Attending: Internal Medicine

## 2019-10-23 DIAGNOSIS — Z23 Encounter for immunization: Secondary | ICD-10-CM | POA: Insufficient documentation

## 2019-10-23 NOTE — Progress Notes (Signed)
   Covid-19 Vaccination Clinic  Name:  James Owen    MRN: [id number] DOB: [birth year]  10/23/2019  James Owen was observed post Covid-19 immunization for 15 minutes without incidence. He was provided with Vaccine Information Sheet and instruction to access the V-Safe system.   James Owen was instructed to call 911 with any severe reactions post vaccine: Marland Kitchen Difficulty breathing  . Swelling of your face and throat  . A fast heartbeat  . A bad rash all over your body  . Dizziness and weakness    Immunizations Administered    Name Date Dose VIS Date Route   Pfizer COVID-19 Vaccine 10/23/2019  4:31 PM 0.3 mL 08/27/2019 Intramuscular   Manufacturer: [place]   [place]ot: [id number]   [place]: [id number]

## 2019-11-11 ENCOUNTER — Ambulatory Visit (INDEPENDENT_AMBULATORY_CARE_PROVIDER_SITE_OTHER): Payer: Medicare Other | Admitting: *Deleted

## 2019-11-11 DIAGNOSIS — I4891 Unspecified atrial fibrillation: Secondary | ICD-10-CM

## 2019-11-11 LAB — CUP PACEART REMOTE DEVICE CHECK
Date Time Interrogation Session: 20210225000247
Implantable Pulse Generator Implant Date: 20190306

## 2019-11-11 NOTE — Progress Notes (Signed)
ILR Remote 

## 2019-11-17 ENCOUNTER — Ambulatory Visit: Payer: Medicare Other | Attending: Internal Medicine

## 2019-11-17 DIAGNOSIS — Z23 Encounter for immunization: Secondary | ICD-10-CM | POA: Insufficient documentation

## 2019-11-17 NOTE — Progress Notes (Signed)
   Covid-19 Vaccination Clinic  Name:  James Owen    MRN: [id number] DOB: 28-Jun-1946  11/17/2019  James Owen was observed post Covid-19 immunization for 15 minutes without incident. He was provided with Vaccine Information Sheet and instruction to access the V-Safe system.   James Owen was instructed to call 911 with any severe reactions post vaccine: Marland Kitchen Difficulty breathing  . Swelling of face and throat  . A fast heartbeat  . A bad rash all over body  . Dizziness and weakness   Immunizations Administered    Name Date Dose VIS Date Route   Pfizer COVID-19 Vaccine 11/17/2019  1:03 PM 0.3 mL 08/27/2019 Intramuscular   Manufacturer: [place]   Lot: [id number]   [place]: [id number]

## 2019-12-12 LAB — CUP PACEART REMOTE DEVICE CHECK
Date Time Interrogation Session: 20210328023130
Implantable Pulse Generator Implant Date: 20190306

## 2019-12-13 ENCOUNTER — Ambulatory Visit (INDEPENDENT_AMBULATORY_CARE_PROVIDER_SITE_OTHER): Payer: Medicare Other | Admitting: *Deleted

## 2019-12-13 DIAGNOSIS — I4891 Unspecified atrial fibrillation: Secondary | ICD-10-CM

## 2019-12-13 NOTE — Progress Notes (Signed)
ILR Remote 

## 2020-01-13 LAB — CUP PACEART REMOTE DEVICE CHECK
Date Time Interrogation Session: 20210428233225
Implantable Pulse Generator Implant Date: 20190306

## 2020-01-17 ENCOUNTER — Ambulatory Visit (INDEPENDENT_AMBULATORY_CARE_PROVIDER_SITE_OTHER): Payer: Medicare Other | Admitting: *Deleted

## 2020-01-17 DIAGNOSIS — I4891 Unspecified atrial fibrillation: Secondary | ICD-10-CM

## 2020-01-17 NOTE — Progress Notes (Signed)
Carelink Summary Report / Loop Recorder 

## 2020-02-15 ENCOUNTER — Ambulatory Visit (INDEPENDENT_AMBULATORY_CARE_PROVIDER_SITE_OTHER): Payer: Medicare Other | Admitting: *Deleted

## 2020-02-15 DIAGNOSIS — I4891 Unspecified atrial fibrillation: Secondary | ICD-10-CM | POA: Diagnosis not present

## 2020-02-15 LAB — CUP PACEART REMOTE DEVICE CHECK
Date Time Interrogation Session: 20210529233449
Implantable Pulse Generator Implant Date: 20190306

## 2020-02-16 NOTE — Progress Notes (Signed)
Carelink Summary Report / Loop Recorder 

## 2020-03-17 ENCOUNTER — Ambulatory Visit (INDEPENDENT_AMBULATORY_CARE_PROVIDER_SITE_OTHER): Payer: Medicare Other | Admitting: *Deleted

## 2020-03-17 DIAGNOSIS — I4891 Unspecified atrial fibrillation: Secondary | ICD-10-CM

## 2020-03-17 LAB — CUP PACEART REMOTE DEVICE CHECK
Date Time Interrogation Session: 20210701230958
Implantable Pulse Generator Implant Date: 20190306

## 2020-03-21 NOTE — Progress Notes (Signed)
Carelink Summary Report / Loop Recorder 

## 2020-04-22 LAB — CUP PACEART REMOTE DEVICE CHECK
Date Time Interrogation Session: 20210803231551
Implantable Pulse Generator Implant Date: 20190306

## 2020-04-24 ENCOUNTER — Ambulatory Visit (INDEPENDENT_AMBULATORY_CARE_PROVIDER_SITE_OTHER): Payer: Medicare Other | Admitting: *Deleted

## 2020-04-24 DIAGNOSIS — I4891 Unspecified atrial fibrillation: Secondary | ICD-10-CM | POA: Diagnosis not present

## 2020-04-24 NOTE — Progress Notes (Signed)
Carelink Summary Report / Loop Recorder 

## 2020-05-29 ENCOUNTER — Ambulatory Visit (INDEPENDENT_AMBULATORY_CARE_PROVIDER_SITE_OTHER): Payer: Medicare Other | Admitting: *Deleted

## 2020-05-29 DIAGNOSIS — I4891 Unspecified atrial fibrillation: Secondary | ICD-10-CM | POA: Diagnosis not present

## 2020-05-29 LAB — CUP PACEART REMOTE DEVICE CHECK
Date Time Interrogation Session: 20210905233443
Implantable Pulse Generator Implant Date: 20190306

## 2020-05-31 NOTE — Progress Notes (Signed)
Carelink Summary Report / Loop Recorder 

## 2020-06-24 LAB — CUP PACEART REMOTE DEVICE CHECK
Date Time Interrogation Session: 20211008233756
Implantable Pulse Generator Implant Date: 20190306

## 2020-07-03 ENCOUNTER — Ambulatory Visit (INDEPENDENT_AMBULATORY_CARE_PROVIDER_SITE_OTHER): Payer: Medicare Other

## 2020-07-03 DIAGNOSIS — I4891 Unspecified atrial fibrillation: Secondary | ICD-10-CM | POA: Diagnosis not present

## 2020-07-07 NOTE — Progress Notes (Signed)
Carelink Summary Report / Loop Recorder 

## 2020-08-07 ENCOUNTER — Ambulatory Visit (INDEPENDENT_AMBULATORY_CARE_PROVIDER_SITE_OTHER): Payer: Medicare Other

## 2020-08-07 DIAGNOSIS — I4891 Unspecified atrial fibrillation: Secondary | ICD-10-CM

## 2020-08-07 LAB — CUP PACEART REMOTE DEVICE CHECK
Date Time Interrogation Session: 20211121232637
Implantable Pulse Generator Implant Date: 20190306

## 2020-08-08 NOTE — Progress Notes (Signed)
Carelink Summary Report / Loop Recorder 

## 2020-08-18 ENCOUNTER — Telehealth: Payer: Self-pay | Admitting: Internal Medicine

## 2020-08-18 NOTE — Telephone Encounter (Signed)
New Message:     Pt would like to know if he can have his pacemaker taken out or disconnected? He says he feels like he does not need it at this time.

## 2020-08-22 NOTE — Telephone Encounter (Signed)
Pt states he had Linq implant in 2019 and would now like to have device removed.  Pt advised will discuss with Dr Caryl Comes and have scheduler call to set up appointment for removal if Dr Caryl Comes agrees.  Pt verbalizes understanding and agrees with current plan.

## 2020-08-22 NOTE — Telephone Encounter (Signed)
Spoke with Dr Caryl Comes who is agreeable with Linq explant.  Will forward information to Dr Olin Pia scheduler to schedule appointment.

## 2020-12-04 ENCOUNTER — Other Ambulatory Visit: Payer: Self-pay

## 2020-12-04 ENCOUNTER — Encounter: Payer: Self-pay | Admitting: Internal Medicine

## 2020-12-04 ENCOUNTER — Ambulatory Visit (INDEPENDENT_AMBULATORY_CARE_PROVIDER_SITE_OTHER): Payer: Medicare Other | Admitting: Internal Medicine

## 2020-12-04 VITALS — BP 140/80 | HR 56 | Ht 73.0 in | Wt 195.0 lb

## 2020-12-04 DIAGNOSIS — I4891 Unspecified atrial fibrillation: Secondary | ICD-10-CM

## 2020-12-04 NOTE — Patient Instructions (Addendum)
Medication Instructions:  Your physician recommends that you continue on your current medications as directed. Please refer to the Current Medication list given to you today.  *If you need a refill on your cardiac medications before your next appointment, please call your pharmacy*   Lab Work: None ordered.  If you have labs (blood work) drawn today and your tests are completely normal, you will receive your results only by: Marland Kitchen MyChart Message (if you have MyChart) OR . A paper copy in the mail If you have any lab test that is abnormal or we need to change your treatment, we will call you to review the results.   Testing/Procedures: Removal of Internal Loop Recorder today   Follow-Up: At [facility], you and your health needs are our priority.  As part of our continuing mission to provide you with exceptional heart care, we have created designated Provider Care Teams.  These Care Teams include your primary Cardiologist (physician) and Advanced Practice Providers (APPs -  Physician Assistants and Nurse Practitioners) who all work together to provide you with the care you need, when you need it.  We recommend signing up for the patient portal called "MyChart".  Sign up information is provided on this After Visit Summary.  MyChart is used to connect with patients for Virtual Visits (Telemedicine).  Patients are able to view lab/test results, encounter notes, upcoming appointments, etc.  Non-urgent messages can be sent to your provider as well.   To learn more about what you can do with MyChart, go to [email or web address].    Your next appointment:   Follow up with Dr Caryl Comes as needed.  You may shower tomorrow 12/05/2020 Remove large outer bandage on Friday 12/08/2020 Leave steri-strips on until they fall off.  You may trim the edges if needed.   Wound Care, Adult Taking care of your wound properly can help to prevent pain, infection, and scarring. It can also help your wound heal  more quickly. Follow instructions from your health care provider about how to care for your wound.   If needed, a clean bandage (dressing) or other type of wound dressing material to cover or place in the wound. Follow your health care provider's instructions about what dressing supplies to use.  How to care for your wound Cleaning the wound Ask your health care provider how to clean the wound. This may include:  Do not rub or scrub the wound. Dressing care  Wash your hands with soap and water for at least 20 seconds before and after you change the dressing. If soap and water are not available, use hand sanitizer.  Change your dressing as told by your health care provider. This may include: ? Cleaning or rinsing out (irrigating) the wound. ? Placing a dressing over the wound or in the wound (packing). ? Covering the wound with an outer dressing.  Leave adhesive strips in place. These skin closures may need to stay in place for 2 weeks or longer. If adhesive strip edges start to loosen and curl up, you may trim the loose edges. Do not remove adhesive strips completely unless your health care provider tells you to do that.  Ask your health care provider when you can leave the wound uncovered. Checking for infection Check your wound area every day for signs of infection. Check for:  More redness, swelling, or pain.  Fluid or blood.  Warmth.  Pus or a bad smell.   Follow these instructions at home Medicines   Take over-the-counter  and prescription medicines only as told by your health care provider. Eating and drinking  Eat a diet that includes protein, vitamin A, vitamin C, and other nutrient-rich foods to help the wound heal. ? Foods rich in protein include meat, fish, eggs, dairy, beans, and nuts. ? Foods rich in vitamin A include carrots and dark green, leafy vegetables. ? Foods rich in vitamin C include citrus fruits, tomatoes, broccoli, and peppers.  Drink enough fluid to  keep your urine pale yellow. General instructions  Do not take baths, swim, use a hot tub, or do anything that would put the wound underwater until your health care provider approves. Ask your health care provider if you may take showers. You may only be allowed to take sponge baths.  Do not scratch or pick at the wound. Keep it covered as told by your health care provider.  Return to your normal activities as told by your health care provider. Ask your health care provider what activities are safe for you.  Protect your wound from the sun when you are outside for the first 6 months, or for as long as told by your health care provider. Cover up the scar area or apply sunscreen that has an SPF of at least 78.  Do not use any products that contain nicotine or tobacco, such as cigarettes, e-cigarettes, and chewing tobacco. These may delay wound healing. If you need help quitting, ask your health care provider.  Keep all follow-up visits as told by your health care provider. This is important. Contact a health care provider if:  You received a tetanus shot and you have swelling, severe pain, redness, or bleeding at the injection site.  Your pain is not controlled with medicine.  You have any of these signs of infection: ? More redness, swelling, or pain around the wound. ? Fluid or blood coming from the wound. ? Warmth coming from the wound. ? Pus or a bad smell coming from the wound. ? A fever or chills.  You are nauseous or you vomit.  You are dizzy. Get help right away if:  You have a red streak of skin near the area around your wound.  Your wound has been closed with staples, sutures, skin glue, or adhesive strips and it begins to open up and separate.  Your wound is bleeding, and the bleeding does not stop with gentle pressure.  You have a rash.  You faint.  You have trouble breathing. These symptoms may represent a serious problem that is an emergency. Do not wait to see  if the symptoms will go away. Get medical help right away. Call your local emergency services (911 in the [place] Do not drive yourself to the hospital. Summary  Always wash your hands with soap and water for at least 20 seconds before and after changing your dressing.  Change your dressing as told by your health care provider.  To help with healing, eat foods that are rich in protein, vitamin A, vitamin C, and other nutrients.  Check your wound every day for signs of infection. Contact your health care provider if you suspect that your wound is infected. This information is not intended to replace advice given to you by your health care provider. Make sure you discuss any questions you have with your health care provider. Document Revised: 06/18/2019 Document Reviewed: 06/18/2019 Elsevier Patient Education  2021 [employer].

## 2020-12-04 NOTE — Progress Notes (Signed)
Patient Care Team: Merrilee Seashore, MD as PCP - General (Internal Medicine) Deboraha Sprang, MD as PCP - Cardiology (Cardiology) Thressa Sheller, MD (Inactive) (Internal Medicine)   HPI  James Owen is a 75 y.o. male Seen in follow-up for atrial fibrillation identified January 2019 when he presented cholecystitis.  He underwent lap chole for gangrenous gallbladder.  A loop recorder was implanted to identify possible non secondary atrial fibrillation>> none was identified.  and anticoagulation not reinitiated  The patient denies chest pain, shortness of breath, nocturnal dyspnea, orthopnea or peripheral edema .  There have been no palpitations, lightheadedness or syncope.          Past Medical History:  Diagnosis Date   CKD (chronic kidney disease)    Dysrhythmia    leep for a fib no episodes in a year   Thyroid disease     Past Surgical History:  Procedure Laterality Date   BIOPSY  11/11/2018   Procedure: BIOPSY;  Surgeon: Rush Landmark, Telford Nab., MD;  Location: [facility];  Service: Gastroenterology;;   BIOPSY  04/07/2019   Procedure: BIOPSY;  Surgeon: Irving Copas., MD;  Location: [facility];  Service: Gastroenterology;;   CHOLECYSTECTOMY N/A 09/19/2017   Procedure: LAPAROSCOPIC CHOLECYSTECTOMY;  Surgeon: Ileana Roup, MD;  Location: [facility];  Service: General;  Laterality: N/A;   COLONOSCOPY WITH PROPOFOL N/A 04/07/2019   Procedure: COLONOSCOPY WITH PROPOFOL;  Surgeon: Irving Copas., MD;  Location: [facility];  Service: Gastroenterology;  Laterality: N/A;   ERCP N/A 11/11/2018   Procedure: ENDOSCOPIC RETROGRADE CHOLANGIOPANCREATOGRAPHY (ERCP);  Surgeon: Irving Copas., MD;  Location: [facility];  Service: Gastroenterology;  Laterality: N/A;   ESOPHAGOGASTRODUODENOSCOPY (EGD) WITH PROPOFOL N/A 04/07/2019   Procedure: ESOPHAGOGASTRODUODENOSCOPY (EGD) WITH PROPOFOL;  Surgeon: Rush Landmark Telford Nab., MD;   Location: [facility];  Service: Gastroenterology;  Laterality: N/A;   LOOP RECORDER INSERTION N/A 11/19/2017   Procedure: LOOP RECORDER INSERTION;  Surgeon: Deboraha Sprang, MD;  Location: [facility];  Service: Cardiovascular;  Laterality: N/A;   MULTIPLE TOOTH EXTRACTIONS     has all dentures   REMOVAL OF STONES  11/11/2018   Procedure: REMOVAL OF STONES;  Surgeon: Rush Landmark Telford Nab., MD;  Location: [place];  Service: Gastroenterology;;   Joan Mayans  11/11/2018   Procedure: Joan Mayans;  Surgeon: Irving Copas., MD;  Location: [place];  Service: Gastroenterology;;   UPPER ESOPHAGEAL ENDOSCOPIC ULTRASOUND (EUS) N/A 04/07/2019   Procedure: UPPER ESOPHAGEAL ENDOSCOPIC ULTRASOUND (EUS);  Surgeon: Irving Copas., MD;  Location: [facility];  Service: Gastroenterology;  Laterality: N/A;    Current Outpatient Medications  Medication Sig Dispense Refill   acetaminophen (TYLENOL) 325 MG tablet Take 650 mg by mouth every 6 (six) hours as needed for moderate pain or headache.     levothyroxine (SYNTHROID, LEVOTHROID) 50 MCG tablet Take 50 mcg by mouth daily before breakfast.     No current facility-administered medications for this visit.    Allergies  Allergen Reactions   Vicodin [Hydrocodone-Acetaminophen] Other (See Comments)    Hallucinations      Review of Systems negative except from HPI and PMH  Physical Exam BP 140/80 (BP Location: Left Arm, Patient Position: Sitting, Cuff Size: Normal)    Pulse (!) 56    Ht 6\' 1"  (1.854 m)    Wt 195 lb (88.5 kg)    SpO2 97%    BMI 25.73 kg/m  Well developed and well nourished in no acute distress HENT normal  Neck supple with JVP-flat Clear Device pocket well healed; without hematoma or erythema.  There is no tethering  Regular rate and rhythm, no  murmur Abd-soft with active BS No Clubbing cyanosis  edema Skin-warm and dry A & Oriented  Grossly normal sensory and motor function  ECG  sinus @ 52 06/23/42  Assessment and  Plan  Atrial fibrillation-persistent non recurrent following surgery  Prior LOOP explanted 3/22  Acute cholecystitis status post lap chole  Renal insufficiency grade 3    Functional status stable  No interval palpitations   Desires loop explantation  James Owen [id number]  [id number]  Preop Dx: loop recorder for the identifying of AFib Postop Dx same/ none  Procedure: device explant Lidocaine infiltrated above the implant site Device removed and wound dressed with steri-strips/tagaderm dressing  Cx: None       James Axe, MD 12/04/2020 9:08 AM        Current medicines are reviewed at length with the patient today .  The patient does not  have concerns regarding medicines.

## 2023-06-18 DIAGNOSIS — E039 Hypothyroidism, unspecified: Secondary | ICD-10-CM | POA: Diagnosis not present

## 2023-06-18 DIAGNOSIS — E785 Hyperlipidemia, unspecified: Secondary | ICD-10-CM | POA: Diagnosis not present

## 2023-06-18 DIAGNOSIS — E559 Vitamin D deficiency, unspecified: Secondary | ICD-10-CM | POA: Diagnosis not present

## 2023-06-18 DIAGNOSIS — N1831 Chronic kidney disease, stage 3a: Secondary | ICD-10-CM | POA: Diagnosis not present

## 2023-06-18 DIAGNOSIS — H9193 Unspecified hearing loss, bilateral: Secondary | ICD-10-CM | POA: Diagnosis not present

## 2023-06-25 DIAGNOSIS — E559 Vitamin D deficiency, unspecified: Secondary | ICD-10-CM | POA: Diagnosis not present

## 2023-06-25 DIAGNOSIS — E785 Hyperlipidemia, unspecified: Secondary | ICD-10-CM | POA: Diagnosis not present

## 2023-06-25 DIAGNOSIS — Z23 Encounter for immunization: Secondary | ICD-10-CM | POA: Diagnosis not present

## 2023-06-25 DIAGNOSIS — H9193 Unspecified hearing loss, bilateral: Secondary | ICD-10-CM | POA: Diagnosis not present

## 2023-06-25 DIAGNOSIS — E039 Hypothyroidism, unspecified: Secondary | ICD-10-CM | POA: Diagnosis not present

## 2023-06-25 DIAGNOSIS — I700 Atherosclerosis of aorta: Secondary | ICD-10-CM | POA: Diagnosis not present

## 2023-06-25 DIAGNOSIS — Z0000 Encounter for general adult medical examination without abnormal findings: Secondary | ICD-10-CM | POA: Diagnosis not present

## 2023-07-14 DIAGNOSIS — H1032 Unspecified acute conjunctivitis, left eye: Secondary | ICD-10-CM | POA: Diagnosis not present

## 2023-07-14 DIAGNOSIS — H16042 Marginal corneal ulcer, left eye: Secondary | ICD-10-CM | POA: Diagnosis not present

## 2023-07-16 DIAGNOSIS — H2000 Unspecified acute and subacute iridocyclitis: Secondary | ICD-10-CM | POA: Diagnosis not present

## 2023-07-16 DIAGNOSIS — H16042 Marginal corneal ulcer, left eye: Secondary | ICD-10-CM | POA: Diagnosis not present

## 2023-07-21 DIAGNOSIS — H16042 Marginal corneal ulcer, left eye: Secondary | ICD-10-CM | POA: Diagnosis not present

## 2023-07-21 DIAGNOSIS — H1789 Other corneal scars and opacities: Secondary | ICD-10-CM | POA: Diagnosis not present

## 2023-08-06 DIAGNOSIS — H5201 Hypermetropia, right eye: Secondary | ICD-10-CM | POA: Diagnosis not present

## 2023-08-06 DIAGNOSIS — H2513 Age-related nuclear cataract, bilateral: Secondary | ICD-10-CM | POA: Diagnosis not present

## 2023-08-06 DIAGNOSIS — H353131 Nonexudative age-related macular degeneration, bilateral, early dry stage: Secondary | ICD-10-CM | POA: Diagnosis not present

## 2023-08-06 DIAGNOSIS — H5212 Myopia, left eye: Secondary | ICD-10-CM | POA: Diagnosis not present

## 2023-08-27 DIAGNOSIS — E785 Hyperlipidemia, unspecified: Secondary | ICD-10-CM | POA: Diagnosis not present

## 2023-09-03 DIAGNOSIS — E785 Hyperlipidemia, unspecified: Secondary | ICD-10-CM | POA: Diagnosis not present

## 2023-09-03 DIAGNOSIS — I700 Atherosclerosis of aorta: Secondary | ICD-10-CM | POA: Diagnosis not present

## 2023-09-03 DIAGNOSIS — D692 Other nonthrombocytopenic purpura: Secondary | ICD-10-CM | POA: Diagnosis not present

## 2023-09-03 DIAGNOSIS — E039 Hypothyroidism, unspecified: Secondary | ICD-10-CM | POA: Diagnosis not present

## 2023-09-03 DIAGNOSIS — H9193 Unspecified hearing loss, bilateral: Secondary | ICD-10-CM | POA: Diagnosis not present

## 2023-09-03 DIAGNOSIS — N1831 Chronic kidney disease, stage 3a: Secondary | ICD-10-CM | POA: Diagnosis not present

## 2023-11-26 DIAGNOSIS — I700 Atherosclerosis of aorta: Secondary | ICD-10-CM | POA: Diagnosis not present

## 2023-11-26 DIAGNOSIS — E785 Hyperlipidemia, unspecified: Secondary | ICD-10-CM | POA: Diagnosis not present

## 2023-12-03 DIAGNOSIS — N1831 Chronic kidney disease, stage 3a: Secondary | ICD-10-CM | POA: Diagnosis not present

## 2023-12-03 DIAGNOSIS — F5101 Primary insomnia: Secondary | ICD-10-CM | POA: Diagnosis not present

## 2023-12-03 DIAGNOSIS — E039 Hypothyroidism, unspecified: Secondary | ICD-10-CM | POA: Diagnosis not present

## 2023-12-03 DIAGNOSIS — G720 Drug-induced myopathy: Secondary | ICD-10-CM | POA: Diagnosis not present

## 2023-12-03 DIAGNOSIS — T466X5A Adverse effect of antihyperlipidemic and antiarteriosclerotic drugs, initial encounter: Secondary | ICD-10-CM | POA: Diagnosis not present

## 2023-12-03 DIAGNOSIS — D692 Other nonthrombocytopenic purpura: Secondary | ICD-10-CM | POA: Diagnosis not present

## 2023-12-03 DIAGNOSIS — E785 Hyperlipidemia, unspecified: Secondary | ICD-10-CM | POA: Diagnosis not present

## 2023-12-03 DIAGNOSIS — I700 Atherosclerosis of aorta: Secondary | ICD-10-CM | POA: Diagnosis not present

## 2024-06-30 DIAGNOSIS — I700 Atherosclerosis of aorta: Secondary | ICD-10-CM | POA: Diagnosis not present

## 2024-06-30 DIAGNOSIS — D692 Other nonthrombocytopenic purpura: Secondary | ICD-10-CM | POA: Diagnosis not present

## 2024-06-30 DIAGNOSIS — E039 Hypothyroidism, unspecified: Secondary | ICD-10-CM | POA: Diagnosis not present

## 2024-06-30 DIAGNOSIS — E785 Hyperlipidemia, unspecified: Secondary | ICD-10-CM | POA: Diagnosis not present

## 2024-06-30 DIAGNOSIS — N1831 Chronic kidney disease, stage 3a: Secondary | ICD-10-CM | POA: Diagnosis not present

## 2024-07-07 DIAGNOSIS — F5101 Primary insomnia: Secondary | ICD-10-CM | POA: Diagnosis not present

## 2024-07-07 DIAGNOSIS — D692 Other nonthrombocytopenic purpura: Secondary | ICD-10-CM | POA: Diagnosis not present

## 2024-07-07 DIAGNOSIS — N390 Urinary tract infection, site not specified: Secondary | ICD-10-CM | POA: Diagnosis not present

## 2024-07-07 DIAGNOSIS — E039 Hypothyroidism, unspecified: Secondary | ICD-10-CM | POA: Diagnosis not present

## 2024-07-07 DIAGNOSIS — Z0000 Encounter for general adult medical examination without abnormal findings: Secondary | ICD-10-CM | POA: Diagnosis not present

## 2024-07-07 DIAGNOSIS — E785 Hyperlipidemia, unspecified: Secondary | ICD-10-CM | POA: Diagnosis not present

## 2024-07-07 DIAGNOSIS — G720 Drug-induced myopathy: Secondary | ICD-10-CM | POA: Diagnosis not present

## 2024-07-07 DIAGNOSIS — Z23 Encounter for immunization: Secondary | ICD-10-CM | POA: Diagnosis not present

## 2024-07-07 DIAGNOSIS — T466X5A Adverse effect of antihyperlipidemic and antiarteriosclerotic drugs, initial encounter: Secondary | ICD-10-CM | POA: Diagnosis not present

## 2024-07-07 DIAGNOSIS — H9193 Unspecified hearing loss, bilateral: Secondary | ICD-10-CM | POA: Diagnosis not present

## 2024-07-07 DIAGNOSIS — N1831 Chronic kidney disease, stage 3a: Secondary | ICD-10-CM | POA: Diagnosis not present
